# Patient Record
Sex: Male | Born: 1935 | Race: White | Hispanic: No | Marital: Married | Smoking: Former smoker
Health system: Southern US, Community
[De-identification: ages and names within clinical notes are randomized; demographics above are authoritative.]

## PROBLEM LIST (undated history)

## (undated) DIAGNOSIS — I2510 Atherosclerotic heart disease of native coronary artery without angina pectoris: Secondary | ICD-10-CM

## (undated) DIAGNOSIS — E119 Type 2 diabetes mellitus without complications: Secondary | ICD-10-CM

## (undated) DIAGNOSIS — I4891 Unspecified atrial fibrillation: Secondary | ICD-10-CM

## (undated) DIAGNOSIS — I639 Cerebral infarction, unspecified: Secondary | ICD-10-CM

## (undated) DIAGNOSIS — E039 Hypothyroidism, unspecified: Secondary | ICD-10-CM

## (undated) DIAGNOSIS — E079 Disorder of thyroid, unspecified: Secondary | ICD-10-CM

## (undated) DIAGNOSIS — I10 Essential (primary) hypertension: Secondary | ICD-10-CM

## (undated) HISTORY — PX: CAROTID STENT: SHX1301

---

## 2015-03-08 ENCOUNTER — Emergency Department (HOSPITAL_COMMUNITY): Payer: Commercial Managed Care - HMO

## 2015-03-08 ENCOUNTER — Encounter (HOSPITAL_COMMUNITY): Payer: Self-pay | Admitting: Nurse Practitioner

## 2015-03-08 ENCOUNTER — Observation Stay (HOSPITAL_COMMUNITY)
Admission: EM | Admit: 2015-03-08 | Discharge: 2015-03-10 | Disposition: A | Discharge status: Home / Self Care | Payer: Commercial Managed Care - HMO | Attending: Internal Medicine | Admitting: Internal Medicine

## 2015-03-08 DIAGNOSIS — R4781 Slurred speech: Secondary | ICD-10-CM | POA: Diagnosis not present

## 2015-03-08 DIAGNOSIS — I2510 Atherosclerotic heart disease of native coronary artery without angina pectoris: Secondary | ICD-10-CM | POA: Diagnosis not present

## 2015-03-08 DIAGNOSIS — Z7982 Long term (current) use of aspirin: Secondary | ICD-10-CM | POA: Diagnosis not present

## 2015-03-08 DIAGNOSIS — N179 Acute kidney failure, unspecified: Secondary | ICD-10-CM | POA: Diagnosis not present

## 2015-03-08 DIAGNOSIS — R531 Weakness: Secondary | ICD-10-CM | POA: Insufficient documentation

## 2015-03-08 DIAGNOSIS — R269 Unspecified abnormalities of gait and mobility: Secondary | ICD-10-CM | POA: Diagnosis not present

## 2015-03-08 DIAGNOSIS — I480 Paroxysmal atrial fibrillation: Secondary | ICD-10-CM | POA: Diagnosis not present

## 2015-03-08 DIAGNOSIS — E119 Type 2 diabetes mellitus without complications: Secondary | ICD-10-CM | POA: Diagnosis not present

## 2015-03-08 DIAGNOSIS — E039 Hypothyroidism, unspecified: Secondary | ICD-10-CM | POA: Diagnosis not present

## 2015-03-08 DIAGNOSIS — D649 Anemia, unspecified: Secondary | ICD-10-CM | POA: Diagnosis present

## 2015-03-08 DIAGNOSIS — E785 Hyperlipidemia, unspecified: Secondary | ICD-10-CM | POA: Insufficient documentation

## 2015-03-08 DIAGNOSIS — Z7901 Long term (current) use of anticoagulants: Secondary | ICD-10-CM | POA: Diagnosis not present

## 2015-03-08 DIAGNOSIS — R4182 Altered mental status, unspecified: Secondary | ICD-10-CM | POA: Diagnosis present

## 2015-03-08 DIAGNOSIS — G450 Vertebro-basilar artery syndrome: Secondary | ICD-10-CM | POA: Diagnosis not present

## 2015-03-08 DIAGNOSIS — I10 Essential (primary) hypertension: Secondary | ICD-10-CM | POA: Diagnosis not present

## 2015-03-08 DIAGNOSIS — I639 Cerebral infarction, unspecified: Secondary | ICD-10-CM | POA: Diagnosis not present

## 2015-03-08 DIAGNOSIS — I4891 Unspecified atrial fibrillation: Secondary | ICD-10-CM | POA: Diagnosis present

## 2015-03-08 DIAGNOSIS — Z6830 Body mass index (BMI) 30.0-30.9, adult: Secondary | ICD-10-CM | POA: Diagnosis not present

## 2015-03-08 DIAGNOSIS — N400 Enlarged prostate without lower urinary tract symptoms: Secondary | ICD-10-CM | POA: Insufficient documentation

## 2015-03-08 DIAGNOSIS — E669 Obesity, unspecified: Secondary | ICD-10-CM | POA: Insufficient documentation

## 2015-03-08 DIAGNOSIS — G459 Transient cerebral ischemic attack, unspecified: Principal | ICD-10-CM | POA: Diagnosis present

## 2015-03-08 HISTORY — DX: Hypothyroidism, unspecified: E03.9

## 2015-03-08 HISTORY — DX: Unspecified atrial fibrillation: I48.91

## 2015-03-08 HISTORY — DX: Essential (primary) hypertension: I10

## 2015-03-08 HISTORY — DX: Atherosclerotic heart disease of native coronary artery without angina pectoris: I25.10

## 2015-03-08 HISTORY — DX: Disorder of thyroid, unspecified: E07.9

## 2015-03-08 HISTORY — DX: Cerebral infarction, unspecified: I63.9

## 2015-03-08 HISTORY — DX: Type 2 diabetes mellitus without complications: E11.9

## 2015-03-08 LAB — URINALYSIS, ROUTINE W REFLEX MICROSCOPIC
Bilirubin Urine: NEGATIVE
Glucose, UA: 250 mg/dL — AB
Ketones, ur: NEGATIVE mg/dL
Leukocytes, UA: NEGATIVE
Nitrite: NEGATIVE
Protein, ur: >300 mg/dL — AB
SPECIFIC GRAVITY, URINE: 1.025 (ref 1.005–1.030)
Urobilinogen, UA: 0.2 mg/dL (ref 0.0–1.0)
pH: 5.5 (ref 5.0–8.0)

## 2015-03-08 LAB — COMPREHENSIVE METABOLIC PANEL
ALT: 18 U/L (ref 0–53)
AST: 22 U/L (ref 0–37)
Albumin: 2.4 g/dL — ABNORMAL LOW (ref 3.5–5.2)
Alkaline Phosphatase: 50 U/L (ref 39–117)
Anion gap: 10 (ref 5–15)
BILIRUBIN TOTAL: 0.5 mg/dL (ref 0.3–1.2)
BUN: 22 mg/dL (ref 6–23)
CALCIUM: 8.8 mg/dL (ref 8.4–10.5)
CO2: 25 mmol/L (ref 19–32)
CREATININE: 1.46 mg/dL — AB (ref 0.50–1.35)
Chloride: 105 mmol/L (ref 96–112)
GFR calc non Af Amer: 44 mL/min — ABNORMAL LOW (ref >90)
GFR, EST AFRICAN AMERICAN: 51 mL/min — AB (ref >90)
Glucose, Bld: 215 mg/dL — ABNORMAL HIGH (ref 70–99)
POTASSIUM: 3.9 mmol/L (ref 3.5–5.1)
Sodium: 140 mmol/L (ref 135–145)
Total Protein: 6 g/dL (ref 6.0–8.3)

## 2015-03-08 LAB — DIFFERENTIAL
Basophils Absolute: 0 10*3/uL (ref 0.0–0.1)
Basophils Relative: 1 % (ref 0–1)
EOS PCT: 1 % (ref 0–5)
Eosinophils Absolute: 0.1 10*3/uL (ref 0.0–0.7)
LYMPHS PCT: 26 % (ref 12–46)
Lymphs Abs: 1.3 10*3/uL (ref 0.7–4.0)
MONOS PCT: 13 % — AB (ref 3–12)
Monocytes Absolute: 0.6 10*3/uL (ref 0.1–1.0)
NEUTROS ABS: 2.9 10*3/uL (ref 1.7–7.7)
NEUTROS PCT: 59 % (ref 43–77)

## 2015-03-08 LAB — PROTIME-INR
INR: 0.96 (ref 0.00–1.49)
Prothrombin Time: 12.8 seconds (ref 11.6–15.2)

## 2015-03-08 LAB — CBC
HEMATOCRIT: 36.7 % — AB (ref 39.0–52.0)
HEMOGLOBIN: 12 g/dL — AB (ref 13.0–17.0)
MCH: 28.2 pg (ref 26.0–34.0)
MCHC: 32.7 g/dL (ref 30.0–36.0)
MCV: 86.2 fL (ref 78.0–100.0)
PLATELETS: 146 10*3/uL — AB (ref 150–400)
RBC: 4.26 MIL/uL (ref 4.22–5.81)
RDW: 18.4 % — ABNORMAL HIGH (ref 11.5–15.5)
WBC: 5 10*3/uL (ref 4.0–10.5)

## 2015-03-08 LAB — I-STAT TROPONIN, ED: Troponin i, poc: 0.01 ng/mL (ref 0.00–0.08)

## 2015-03-08 LAB — URINE MICROSCOPIC-ADD ON

## 2015-03-08 LAB — APTT: APTT: 32 s (ref 24–37)

## 2015-03-08 LAB — I-STAT CHEM 8, ED
BUN: 24 mg/dL — ABNORMAL HIGH (ref 6–23)
CALCIUM ION: 1.17 mmol/L (ref 1.13–1.30)
CHLORIDE: 107 mmol/L (ref 96–112)
Creatinine, Ser: 1.4 mg/dL — ABNORMAL HIGH (ref 0.50–1.35)
GLUCOSE: 218 mg/dL — AB (ref 70–99)
HCT: 36 % — ABNORMAL LOW (ref 39.0–52.0)
Hemoglobin: 12.2 g/dL — ABNORMAL LOW (ref 13.0–17.0)
Potassium: 4 mmol/L (ref 3.5–5.1)
SODIUM: 142 mmol/L (ref 135–145)
TCO2: 23 mmol/L (ref 0–100)

## 2015-03-08 LAB — ETHANOL

## 2015-03-08 LAB — RAPID URINE DRUG SCREEN, HOSP PERFORMED
AMPHETAMINES: NOT DETECTED
Barbiturates: NOT DETECTED
Benzodiazepines: NOT DETECTED
Cocaine: NOT DETECTED
OPIATES: NOT DETECTED
Tetrahydrocannabinol: NOT DETECTED

## 2015-03-08 LAB — CBG MONITORING, ED: Glucose-Capillary: 182 mg/dL — ABNORMAL HIGH (ref 70–99)

## 2015-03-08 MED ORDER — ASPIRIN EC 81 MG PO TBEC
81.0000 mg | DELAYED_RELEASE_TABLET | Freq: Every day | ORAL | Status: DC
  Administered 2015-03-08 – 2015-03-10 (×3): 81 mg via ORAL
  Filled 2015-03-08 (×3): qty 1

## 2015-03-08 MED ORDER — HYDRALAZINE HCL 20 MG/ML IJ SOLN
5.0000 mg | Freq: Four times a day (QID) | INTRAMUSCULAR | Status: DC | PRN
  Administered 2015-03-09 (×2): 5 mg via INTRAVENOUS
  Filled 2015-03-08 (×2): qty 1

## 2015-03-08 MED ORDER — TAMSULOSIN HCL 0.4 MG PO CAPS
0.4000 mg | ORAL_CAPSULE | Freq: Every day | ORAL | Status: DC
  Administered 2015-03-09 (×2): 0.4 mg via ORAL
  Filled 2015-03-08 (×2): qty 1

## 2015-03-08 MED ORDER — ASPIRIN 325 MG PO TABS: 325.0000 mg | ORAL_TABLET | Freq: Every day | ORAL | Status: DC

## 2015-03-08 MED ORDER — ENOXAPARIN SODIUM 30 MG/0.3ML ~~LOC~~ SOLN
30.0000 mg | SUBCUTANEOUS | Status: DC
  Administered 2015-03-09: 30 mg via SUBCUTANEOUS
  Filled 2015-03-08: qty 0.3

## 2015-03-08 MED ORDER — ASPIRIN 300 MG RE SUPP: 300.0000 mg | Freq: Every day | RECTAL | Status: DC

## 2015-03-08 MED ORDER — HYDROMORPHONE HCL 1 MG/ML IJ SOLN
0.5000 mg | INTRAMUSCULAR | Status: DC | PRN
  Administered 2015-03-09: 1 mg via INTRAVENOUS
  Filled 2015-03-08: qty 1

## 2015-03-08 MED ORDER — SENNOSIDES-DOCUSATE SODIUM 8.6-50 MG PO TABS: 1.0000 | ORAL_TABLET | Freq: Every evening | ORAL | Status: DC | PRN

## 2015-03-08 MED ORDER — ATENOLOL 100 MG PO TABS
100.0000 mg | ORAL_TABLET | Freq: Two times a day (BID) | ORAL | Status: DC
  Administered 2015-03-09 – 2015-03-10 (×4): 100 mg via ORAL
  Filled 2015-03-08 (×4): qty 1

## 2015-03-08 MED ORDER — ADULT MULTIVITAMIN W/MINERALS CH
1.0000 | ORAL_TABLET | Freq: Every day | ORAL | Status: DC
  Administered 2015-03-09 – 2015-03-10 (×2): 1 via ORAL
  Filled 2015-03-08 (×2): qty 1

## 2015-03-08 MED ORDER — LISINOPRIL 20 MG PO TABS
40.0000 mg | ORAL_TABLET | Freq: Every day | ORAL | Status: DC
  Administered 2015-03-09: 40 mg via ORAL
  Filled 2015-03-08: qty 2

## 2015-03-08 MED ORDER — HYDROCHLOROTHIAZIDE 25 MG PO TABS: 25.0000 mg | ORAL_TABLET | Freq: Every day | ORAL | Status: DC | PRN

## 2015-03-08 MED ORDER — DILTIAZEM HCL ER COATED BEADS 240 MG PO CP24
240.0000 mg | ORAL_CAPSULE | Freq: Every day | ORAL | Status: DC
  Administered 2015-03-09 – 2015-03-10 (×2): 240 mg via ORAL
  Filled 2015-03-08 (×2): qty 1

## 2015-03-08 MED ORDER — STROKE: EARLY STAGES OF RECOVERY BOOK
Freq: Once | Status: AC
  Administered 2015-03-09: 1

## 2015-03-08 MED ORDER — INSULIN ASPART 100 UNIT/ML ~~LOC~~ SOLN
0.0000 [IU] | SUBCUTANEOUS | Status: DC
  Administered 2015-03-09 (×2): 2 [IU] via SUBCUTANEOUS
  Administered 2015-03-09 (×2): 1 [IU] via SUBCUTANEOUS
  Administered 2015-03-09: 2 [IU] via SUBCUTANEOUS
  Administered 2015-03-10: 1 [IU] via SUBCUTANEOUS
  Administered 2015-03-10: 2 [IU] via SUBCUTANEOUS

## 2015-03-08 MED ORDER — PRAVASTATIN SODIUM 40 MG PO TABS: 40.0000 mg | ORAL_TABLET | Freq: Every day | ORAL | Status: DC

## 2015-03-08 MED ORDER — LEVOTHYROXINE SODIUM 25 MCG PO TABS
25.0000 ug | ORAL_TABLET | Freq: Every day | ORAL | Status: DC
  Administered 2015-03-09 – 2015-03-10 (×2): 25 ug via ORAL
  Filled 2015-03-08 (×2): qty 1

## 2015-03-08 MED ORDER — ALLOPURINOL 100 MG PO TABS
100.0000 mg | ORAL_TABLET | Freq: Every day | ORAL | Status: DC
  Administered 2015-03-09 – 2015-03-10 (×2): 100 mg via ORAL
  Filled 2015-03-08 (×2): qty 1

## 2015-03-08 MED ORDER — ONDANSETRON HCL 4 MG/2ML IJ SOLN
INTRAMUSCULAR | Status: AC
  Administered 2015-03-08: 4 mg
  Filled 2015-03-08: qty 2

## 2015-03-08 MED ORDER — SODIUM CHLORIDE 0.9 % IV SOLN
INTRAVENOUS | Status: AC
  Administered 2015-03-08: 22:00:00 via INTRAVENOUS

## 2015-03-08 NOTE — ED Provider Notes (Signed)
CSN: [id number]     Arrival date & time 03/08/15  1853 History   First MD Initiated Contact with Patient 03/08/15 1910     Chief Complaint  Patient presents with  . Altered Mental Status  . Code Stroke     (Consider location/radiation/quality/duration/timing/severity/associated sxs/prior Treatment) Patient is a 81 y.o. male presenting with altered mental status. The history is provided by the EMS personnel. The history is limited by the condition of the patient.  Altered Mental Status  patient level V caveat. Patient with marked confusion.  Patient last seen normal by family at 1:00 in the afternoon. EMS stated patient was recently treated for a CVA 6 weeks ago at [facility]. Patient's family noted increased confusion and abnormal gait. Patient not able to speak very well. Patient could follow some commands. No facial droop was noted.  Patient was made a code stroke prior to arrival.  Past Medical History  Diagnosis Date  . CVA (cerebral infarction)   . Coronary artery disease   . Hypertension   . Thyroid disease   . Hypothyroid   . Diabetes mellitus without complication   . Atrial fibrillation    Past Surgical History  Procedure Laterality Date  . Carotid stent     No family history on file. History  Substance Use Topics  . Smoking status: Not on file  . Smokeless tobacco: Not on file  . Alcohol Use: Not on file    Review of Systems  Unable to perform ROS  level V caveat applies patient with marked confusion upon arrival.    Allergies  Epinephrine; Amiodarone; Tramadol; Xarelto; and Dabigatran etexilate mesylate  Home Medications   Prior to Admission medications   Medication Sig Start Date End Date Taking? Authorizing Provider  acetaminophen (TYLENOL) 325 MG tablet Take 325-650 mg by mouth every 6 (six) hours as needed (pain).   Yes Historical Provider, MD  allopurinol (ZYLOPRIM) 100 MG tablet Take 100 mg by mouth daily. 02/27/15  Yes Historical Provider,  MD  aspirin EC 81 MG tablet Take 81 mg by mouth daily.    Yes Historical Provider, MD  atenolol (TENORMIN) 100 MG tablet Take 100 mg by mouth 2 (two) times daily.  03/06/15  Yes Historical Provider, MD  diltiazem (CARDIZEM CD) 240 MG 24 hr capsule Take 240 mg by mouth daily. 02/22/15  Yes Historical Provider, MD  hydrochlorothiazide (HYDRODIURIL) 25 MG tablet Take 25 mg by mouth daily as needed (swelling/ fluid build up).    Yes Historical Provider, MD  levothyroxine (SYNTHROID, LEVOTHROID) 25 MCG tablet Take 25 mcg by mouth daily before breakfast.  02/17/15  Yes Historical Provider, MD  lisinopril (PRINIVIL,ZESTRIL) 40 MG tablet Take 40 mg by mouth daily.  03/06/15  Yes Historical Provider, MD  lovastatin (MEVACOR) 40 MG tablet Take 80 mg by mouth at bedtime.  02/07/15  Yes Historical Provider, MD  metFORMIN (GLUCOPHAGE) 850 MG tablet Take 850 mg by mouth 2 (two) times daily with a meal. With breakfast and supper 03/06/15  Yes Historical Provider, MD  Multiple Vitamin (MULTIVITAMIN WITH MINERALS) TABS tablet Take 1 tablet by mouth daily.   Yes Historical Provider, MD  tamsulosin (FLOMAX) 0.4 MG CAPS capsule Take 0.4 mg by mouth at bedtime. 02/27/15  Yes Historical Provider, MD  Testosterone 20.25 MG/1.25GM (1.62%) GEL Place 81 mg onto the skin daily. 4 pumps daily   Yes Historical Provider, MD   BP 219/74 mmHg  Pulse 80  Temp(Src) 98.6 F (37 C) (Oral)  Resp  13  Ht 5' 10.5" (1.791 m)  Wt 216 lb (97.977 kg)  BMI 30.54 kg/m2  SpO2 96% Physical Exam  Constitutional: He appears well-developed and well-nourished.  HENT:  Head: Normocephalic and atraumatic.  Mouth/Throat: Oropharynx is clear and moist.  Eyes: Conjunctivae and EOM are normal. Pupils are equal, round, and reactive to light.  Neck: Normal range of motion.  Abdominal: Soft. Bowel sounds are normal.  Musculoskeletal: He exhibits no tenderness.  Neurological: A cranial nerve deficit is present. He exhibits normal muscle tone. Coordination  normal.  Confused and difficulty following commands. Not able to speak very well. Questionable generalized weakness to arms and legs. No obvious weakness.  Skin: Skin is warm.  Nursing note and vitals reviewed.   ED Course  Procedures (including critical care time) Labs Review Labs Reviewed  CBC - Abnormal; Notable for the following:    Hemoglobin 12.0 (*)    HCT 36.7 (*)    RDW 18.4 (*)    Platelets 146 (*)    All other components within normal limits  DIFFERENTIAL - Abnormal; Notable for the following:    Monocytes Relative 13 (*)    All other components within normal limits  COMPREHENSIVE METABOLIC PANEL - Abnormal; Notable for the following:    Glucose, Bld 215 (*)    Creatinine, Ser 1.46 (*)    Albumin 2.4 (*)    GFR calc non Af Amer 44 (*)    GFR calc Af Amer 51 (*)    All other components within normal limits  URINALYSIS, ROUTINE W REFLEX MICROSCOPIC - Abnormal; Notable for the following:    Glucose, UA 250 (*)    Hgb urine dipstick SMALL (*)    Protein, ur >300 (*)    All other components within normal limits  I-STAT CHEM 8, ED - Abnormal; Notable for the following:    BUN 24 (*)    Creatinine, Ser 1.40 (*)    Glucose, Bld 218 (*)    Hemoglobin 12.2 (*)    HCT 36.0 (*)    All other components within normal limits  CBG MONITORING, ED - Abnormal; Notable for the following:    Glucose-Capillary 182 (*)    All other components within normal limits  ETHANOL  PROTIME-INR  APTT  URINE RAPID DRUG SCREEN (HOSP PERFORMED)  URINE MICROSCOPIC-ADD ON  I-STAT TROPOININ, ED  I-STAT TROPOININ, ED    Imaging Review Ct Head Wo Contrast  03/08/2015   CLINICAL DATA:  Altered mental status.  Stroke 6 weeks ago.  EXAM: CT HEAD WITHOUT CONTRAST  TECHNIQUE: Contiguous axial images were obtained from the base of the skull through the vertex without intravenous contrast.  COMPARISON:  Brain MR dated 03/21/2012 and head CT dated 10/08/2011.  FINDINGS: Diffusely enlarged ventricles  and subarachnoid spaces. Patchy white matter low density in both cerebral hemispheres. Old right cerebellar hemisphere lacunar infarct without significant change. No intracranial hemorrhage, mass lesion or CT evidence of acute infarction. Unremarkable bones. Inferior bilateral maxillary sinus mucosal thickening and retention cyst formation.  IMPRESSION: 1. No acute abnormality. 2. Mildly progressive atrophy and chronic small vessel white matter ischemic changes and stable old right cerebellar hemisphere lacunar infarct. 3. Mild chronic bilateral maxillary sinusitis. These results were called by telephone at the time of interpretation on 03/08/2015 at 7:16 pm to Dr. Armida Sans, who verbally acknowledged these results.   Electronically Signed   By: Claudie Revering M.D.   On: 03/08/2015 19:16   [name]  03/08/2015  CLINICAL DATA:  81 year old male code stroke with confusion, impaired gait, nausea and vomiting. Ischemic stroke 4 weeks ago treated with thrombolytics. Initial encounter.  EXAM: MRI HEAD WITHOUT CONTRAST  MRA HEAD WITHOUT CONTRAST  TECHNIQUE: Multiplanar, multiecho pulse sequences of the brain and surrounding structures were obtained without intravenous contrast. Angiographic images of the head were obtained using MRA technique without contrast.  COMPARISON:  Head CT without contrast 1911 hours today. Brain MRI 03/21/2012  FINDINGS: MRI HEAD FINDINGS  Major intracranial vascular flow voids are stable. No restricted diffusion to suggest acute infarction. No midline shift, mass effect, evidence of mass lesion, ventriculomegaly, extra-axial collection or acute intracranial hemorrhage. Cervicomedullary junction and pituitary are within normal limits. Cerebral volume has not significantly changed since 2013. Scattered and patchy cerebral white matter T2 and FLAIR hyperintensity also appears stable. A small chronic infarct in the inferior right cerebellum is unchanged.  Normal bone marrow signal.  Negative visualized cervical spine. Visualized orbit soft tissues are within normal limits. Stable paranasal sinuses and mastoids. Visualized scalp soft tissues are within normal limits.  MRA HEAD FINDINGS  Antegrade flow in the posterior circulation with codominant distal vertebral arteries. Patent right PICA origin. Normal vertebrobasilar junction. Dominant appearing right AICA. No basilar artery stenosis. SCA and PCA origins are within normal limits. Posterior communicating arteries are diminutive or absent.  Left PCA branches are within normal limits. Moderate stenosis at the origin of the right P3 superior division with preserved distal flow.  Antegrade flow in both ICA siphons. Moderate stenosis of the right ICA cavernous segment. Both carotid termini are patent.  Dominant appearing left ACA A1 segment. Anterior communicating artery and visualized bilateral ACA branches are within normal limits.  Both MCA origins are patent. There is mild stenosis in the left M1 segment with preserved distal flow and patent left MCA bifurcation. No major left MCA branch occlusion identified. Mild irregularity of the right M1 segment with patent right MCA bifurcation and no major right MCA branch occlusion identified.  IMPRESSION: 1.  No acute intracranial abnormality. 2. Stable appearance of chronic ischemic disease since 2013. 3. Intracranial atherosclerosis with moderate cavernous right ICA and right PCA P3 segment stenoses. No major circle of Willis branch occlusion.   Electronically Signed   By: Genevie Ann M.D.   On: 03/08/2015 21:16   Mr Brain Wo Contrast  03/08/2015   CLINICAL DATA:  81 year old male code stroke with confusion, impaired gait, nausea and vomiting. Ischemic stroke 4 weeks ago treated with thrombolytics. Initial encounter.  EXAM: MRI HEAD WITHOUT CONTRAST  MRA HEAD WITHOUT CONTRAST  TECHNIQUE: Multiplanar, multiecho pulse sequences of the brain and surrounding structures were obtained without intravenous  contrast. Angiographic images of the head were obtained using MRA technique without contrast.  COMPARISON:  Head CT without contrast 1911 hours today. Brain MRI 03/21/2012  FINDINGS: MRI HEAD FINDINGS  Major intracranial vascular flow voids are stable. No restricted diffusion to suggest acute infarction. No midline shift, mass effect, evidence of mass lesion, ventriculomegaly, extra-axial collection or acute intracranial hemorrhage. Cervicomedullary junction and pituitary are within normal limits. Cerebral volume has not significantly changed since 2013. Scattered and patchy cerebral white matter T2 and FLAIR hyperintensity also appears stable. A small chronic infarct in the inferior right cerebellum is unchanged.  Normal bone marrow signal. Negative visualized cervical spine. Visualized orbit soft tissues are within normal limits. Stable paranasal sinuses and mastoids. Visualized scalp soft tissues are within normal limits.  MRA HEAD FINDINGS  Antegrade flow in the  posterior circulation with codominant distal vertebral arteries. Patent right PICA origin. Normal vertebrobasilar junction. Dominant appearing right AICA. No basilar artery stenosis. SCA and PCA origins are within normal limits. Posterior communicating arteries are diminutive or absent.  Left PCA branches are within normal limits. Moderate stenosis at the origin of the right P3 superior division with preserved distal flow.  Antegrade flow in both ICA siphons. Moderate stenosis of the right ICA cavernous segment. Both carotid termini are patent.  Dominant appearing left ACA A1 segment. Anterior communicating artery and visualized bilateral ACA branches are within normal limits.  Both MCA origins are patent. There is mild stenosis in the left M1 segment with preserved distal flow and patent left MCA bifurcation. No major left MCA branch occlusion identified. Mild irregularity of the right M1 segment with patent right MCA bifurcation and no major right MCA  branch occlusion identified.  IMPRESSION: 1.  No acute intracranial abnormality. 2. Stable appearance of chronic ischemic disease since 2013. 3. Intracranial atherosclerosis with moderate cavernous right ICA and right PCA P3 segment stenoses. No major circle of Willis branch occlusion.   Electronically Signed   By: Genevie Ann M.D.   On: 03/08/2015 21:16     EKG Interpretation   Date/Time:  Saturday March 08 2015 19:21:04 EDT Ventricular Rate:  63 PR Interval:  163 QRS Duration: 93 QT Interval:  486 QTC Calculation: 498 R Axis:   60 Text Interpretation:  Sinus rhythm Anterior infarct, old Baseline wander  in lead(s) V3 No previous ECGs available Confirmed by Kourtnie Sachs  MD, Lunden Mcleish  [phone]) on 03/08/2015 9:21:05 PM      CRITICAL CARE Performed by: Fredia Sorrow Total critical care time: 30 Critical care time was exclusive of separately billable procedures and treating other patients. Critical care was necessary to treat or prevent imminent or life-threatening deterioration. Critical care was time spent personally by me on the following activities: development of treatment plan with patient and/or surrogate as well as nursing, discussions with consultants, evaluation of patient's response to treatment, examination of patient, obtaining history from patient or surrogate, ordering and performing treatments and interventions, ordering and review of laboratory studies, ordering and review of radiographic studies, pulse oximetry and re-evaluation of patient's condition.     MDM   Final diagnoses:  Stroke with cerebral ischemia  Stroke with cerebral ischemia    Patient arrived as a code stroke was called by EMS prior to arrival. Patient was last seen normal at 1:00 by family. Patient with confusion and somewhat altered mental status and seemed to have weakness according to EMS in all extremities but right hand greater than others. Patient was able to follow some commands but not all. Head CT  negative. Evaluated by nerve neurology. Recommended admission. MRI ordered.  [place] were his primary care provider's. Patient 3 weeks ago had a. Return episode where he got confused while driving. So his primary care doctor a week later they sent him to urology for workup for possible stroke. But that workup has not been completed. He did not have an MRI. Family has noted that he has had some confusion on and off of late. No formal diagnosis of CVA.    Fredia Sorrow, MD 03/08/15 2132

## 2015-03-08 NOTE — ED Notes (Signed)
Per EMS pt from home, LSN 1300 by family. Was recently treated for CVA 6 weeks ago. Family noted increased confusion and abnormal gait- EMS did not note any facial droop, or any weakness particular to one side. Patient follows some commands and can say first name but inappropriately answers other questions.

## 2015-03-08 NOTE — Consult Note (Signed)
Referring Physician: ED    Chief Complaint: code stroke, confusion, impaired gait  HPI:                                                                                                                                         Mario Small is an 81 y.o. male with a past medical history significant for HTN, DM, CAD s/p stenting, atrial fibrillation no taking anticoagulants, ischemic stroke 4 weeks that according to EMS was treated with thrombolytics at an outside facility, brought in by EMS as a code stroke due to the4 above mentioned symptoms. Patient lives at home and was noted by family to be increasingly confused (had been confused and forgetful since his stroke), making no sense during conversations, completely off balance. Family said that he has been falling lately. He is confused but without obvious focal weakness at this moment. Very nauseated and vomiting. NIHSS 6. CT brain was personally reviewed and showed no acute abnormality.  Date last known well: 03/08/15 Time last known well: 1 pm tPA Given: no, out of the window NIHSS: 6   Past Medical History  Diagnosis Date  . CVA (cerebral infarction)   . Coronary artery disease   . Hypertension   . Thyroid disease   . Hypothyroid   . Diabetes mellitus without complication   . Atrial fibrillation     Past Surgical History  Procedure Laterality Date  . Carotid stent      No family history on file. Social History:  has no tobacco, alcohol, and drug history on file.  Family history: no brain tumors, brain aneurysms, or epilepsy  Allergies: No Known Allergies  Medications:                                                                                                                           I have reviewed the patient's current medications.  ROS: unable to obtain due to mental status  History  obtained from chart review and family   Physical exam: pleasantly confused male in no apparent distress. Blood pressure 219/74, pulse 80, temperature 98.3 F (36.8 C), temperature source Oral, resp. rate 13, height 5' 10.5" (1.791 m), weight 97.977 kg (216 lb), SpO2 96 %. Head: normocephalic. Neck: supple, no bruits, no JVD. Cardiac: no murmurs. Lungs: clear. Abdomen: soft, no tender, no mass. Extremities: mild bilateral LE edema. Skin: no rash Neurologic Examination:                                                                                                      General: Mental Status: Alert and awake but disoriented to place-year-month.  Speech fluent without evidence of aphasia.  Able to follow 3 step commands without difficulty. Cranial Nerves: II: Discs flat bilaterally; Visual fields grossly normal, pupils equal, round, reactive to light and accommodation III,IV, VI: ptosis not present, extra-ocular motions intact bilaterally V,VII: smile symmetric, facial light touch sensation normal bilaterally VIII: hearing normal bilaterally IX,X: uvula rises symmetrically XI: bilateral shoulder shrug XII: midline tongue extension without atrophy or fasciculations  Motor: Right : Upper extremity   5/5    Left:     Upper extremity   5/5  Lower extremity   5/5     Lower extremity   5/5 Tone and bulk:normal tone throughout; no atrophy noted Sensory: Pinprick and light touch intact throughout, bilaterally Deep Tendon Reflexes:  Right: Upper Extremity   Left: Upper extremity   biceps (C-5 to C-6) 2/4   biceps (C-5 to C-6) 2/4 tricep (C7) 2/4    triceps (C7) 2/4 Brachioradialis (C6) 2/4  Brachioradialis (C6) 2/4  Lower Extremity Lower Extremity  quadriceps (L-2 to L-4) 2/4   quadriceps (L-2 to L-4) 2/4 Achilles (S1) 2/4   Achilles (S1) 2/4  Plantars: Right: downgoing   Left: downgoing Cerebellar: Unable to test due to confusion Gait:  Unable to test at this  moment     Results for orders placed or performed during the hospital encounter of 03/08/15 (from the past 48 hour(s))  I-Stat Troponin, ED (not at [facility])     Status: None   Collection Time: 03/08/15  7:06 PM  Result Value Ref Range   Troponin i, poc 0.01 0.00 - 0.08 ng/mL   Comment 3            Comment: Due to the release kinetics of cTnI, a negative result within the first hours of the onset of symptoms does not rule out myocardial infarction with certainty. If myocardial infarction is still suspected, repeat the test at appropriate intervals.   I-Stat Chem 8, ED     Status: Abnormal   Collection Time: 03/08/15  7:08 PM  Result Value Ref Range   Sodium 142 135 - 145 mmol/L   Potassium 4.0 3.5 - 5.1 mmol/L   Chloride 107 96 - 112 mmol/L   BUN 24 (H) 6 - 23 mg/dL   Creatinine, Ser 1.40 (H) 0.50 - 1.35 mg/dL   Glucose, Bld 218 (H) 70 - 99 mg/dL   Calcium, Ion 1.17  1.13 - 1.30 mmol/L   TCO2 23 0 - 100 mmol/L   Hemoglobin 12.2 (L) 13.0 - 17.0 g/dL   HCT 36.0 (L) 39.0 - 52.0 %   Ct Head Wo Contrast  03/08/2015   CLINICAL DATA:  Altered mental status.  Stroke 6 weeks ago.  EXAM: CT HEAD WITHOUT CONTRAST  TECHNIQUE: Contiguous axial images were obtained from the base of the skull through the vertex without intravenous contrast.  COMPARISON:  Brain MR dated 03/21/2012 and head CT dated 10/08/2011.  FINDINGS: Diffusely enlarged ventricles and subarachnoid spaces. Patchy white matter low density in both cerebral hemispheres. Old right cerebellar hemisphere lacunar infarct without significant change. No intracranial hemorrhage, mass lesion or CT evidence of acute infarction. Unremarkable bones. Inferior bilateral maxillary sinus mucosal thickening and retention cyst formation.  IMPRESSION: 1. No acute abnormality. 2. Mildly progressive atrophy and chronic small vessel white matter ischemic changes and stable old right cerebellar hemisphere lacunar infarct. 3. Mild chronic bilateral maxillary  sinusitis. These results were called by telephone at the time of interpretation on 03/08/2015 at 7:16 pm to Dr. Armida Sans, who verbally acknowledged these results.   Electronically Signed   By: Claudie Revering M.D.   On: 03/08/2015 19:16     Assessment: 81 y.o. male with multiple risk factors for stroke, brought in with worsening confusion, unsteadiness. Nauseated and vomiting in the ED. NIHSS 6, unrevealing CT brain. Out of the window for IV thrombolysis and large vessel thrombus/clot seems unlikely. Suspect posterior circulation stroke. Unable to locate outside medical records, thus will consider repeating stroke work up. Aspirin after passing swallowing evaluation   Stroke Risk Factors - age, HTN, DM, CAD s/p stenting, atrial fibrillation, prior stroke   Plan: 1. HgbA1c, fasting lipid panel 2. MRI, MRA  of the brain without contrast 3. Echocardiogram 4. Carotid dopplers 5. Prophylactic therapy-aspirin 6. Risk factor modification 7. Telemetry monitoring 8. Frequent neuro checks 9. PT/OT SLP 10. NPO until swallowing evaluation by RN stroke.  Dorian Pod, MD [facility] [phone]  03/08/2015, 7:24 PM

## 2015-03-08 NOTE — H&P (Signed)
[facility] Admission History and Physical       Mario Small XFG:[id number] DOB: 1933-06-29 DOA: 03/08/2015  Referring physician: EDP PCP: No primary care provider on file.  Specialists:   Chief Complaint: Headache Confusion Slurred Speech and Weakness  HPI: Mario Small is a 81 y.o. male with a history of CVA (in 2013), HTN, CAD, Paroxsymal Atrial Fibrillation ( with Adverse Effects on Anticoagulants), HTN, DM2 who presents to the ED with complaints of increased confusion, with difficulty speaking, and difficulty walking   noticed by family today around 2 PM.   He also was complaining of a severe Headache in the back of his head, and his blood pressures were checked and found to be with a systolic of 967.   His family was concerned that he may be having a stroke and had EMS brought him to [facility] from his home in [place].   He was seen in the ED as  A Code Stroke and was outside of the widow for TPA, and  A CT scan of the Head and MRI and MRA of the Brain were performed and were negative for findings of a Stroke.  He was seen by Neurology Dr Aram Beecham in the ED and referred for further evaluation.        Review of Systems:  Constitutional: No Weight Loss, No Weight Gain, Night Sweats, Fevers, Chills, +Dizziness, Light Headedness, Fatigue, +Generalized Weakness HEENT:  +Headaches, Difficulty Swallowing,Tooth/Dental Problems,Sore Throat,  No Sneezing, Rhinitis, Ear Ache, Nasal Congestion, or Post Nasal Drip,  Cardio-vascular:  No Chest pain, Orthopnea, PND, Edema in Lower Extremities, Anasarca, Dizziness, Palpitations  Resp: No Dyspnea, No DOE, No Productive Cough, No Non-Productive Cough, No Hemoptysis, No Wheezing.    GI: No Heartburn, Indigestion, Abdominal Pain, Nausea, Vomiting, Diarrhea, Constipation, Hematemesis, Hematochezia, Melena, Change in Bowel Habits,  Loss of Appetite  GU: No Dysuria, No Change in Color of Urine, No Urgency or Urinary Frequency, No  Flank pain.  Musculoskeletal: No Joint Pain or Swelling, No Decreased Range of Motion, No Back Pain.  Neurologic: No Syncope, No Seizures, Muscle Weakness, Paresthesia, Vision Disturbance or Loss, No Diplopia, No Vertigo, +Difficulty Walking,  Skin: No Rash or Lesions. Psych: No Change in Mood or Affect, No Depression or Anxiety, No Memory loss,+Confusion, or Hallucinations   Past Medical History  Diagnosis Date  . CVA (cerebral infarction)   . Coronary artery disease   . Hypertension   . Thyroid disease   . Hypothyroid   . Diabetes mellitus without complication   . Atrial fibrillation      Past Surgical History  Procedure Laterality Date  . Carotid stent        Prior to Admission medications   Medication Sig Start Date End Date Taking? Authorizing Provider  acetaminophen (TYLENOL) 325 MG tablet Take 325-650 mg by mouth every 6 (six) hours as needed (pain).   Yes Historical Provider, MD  allopurinol (ZYLOPRIM) 100 MG tablet Take 100 mg by mouth daily. 02/27/15  Yes Historical Provider, MD  aspirin EC 81 MG tablet Take 81 mg by mouth daily.    Yes Historical Provider, MD  atenolol (TENORMIN) 100 MG tablet Take 100 mg by mouth 2 (two) times daily.  03/06/15  Yes Historical Provider, MD  diltiazem (CARDIZEM CD) 240 MG 24 hr capsule Take 240 mg by mouth daily. 02/22/15  Yes Historical Provider, MD  hydrochlorothiazide (HYDRODIURIL) 25 MG tablet Take 25 mg by mouth daily as needed (swelling/ fluid build up).  Yes Historical Provider, MD  levothyroxine (SYNTHROID, LEVOTHROID) 25 MCG tablet Take 25 mcg by mouth daily before breakfast.  02/17/15  Yes Historical Provider, MD  lisinopril (PRINIVIL,ZESTRIL) 40 MG tablet Take 40 mg by mouth daily.  03/06/15  Yes Historical Provider, MD  lovastatin (MEVACOR) 40 MG tablet Take 80 mg by mouth at bedtime.  02/07/15  Yes Historical Provider, MD  metFORMIN (GLUCOPHAGE) 850 MG tablet Take 850 mg by mouth 2 (two) times daily with a meal. With breakfast and  supper 03/06/15  Yes Historical Provider, MD  Multiple Vitamin (MULTIVITAMIN WITH MINERALS) TABS tablet Take 1 tablet by mouth daily.   Yes Historical Provider, MD  tamsulosin (FLOMAX) 0.4 MG CAPS capsule Take 0.4 mg by mouth at bedtime. 02/27/15  Yes Historical Provider, MD  Testosterone 20.25 MG/1.25GM (1.62%) GEL Place 81 mg onto the skin daily. 4 pumps daily/ apply to inner thighs   Yes Historical Provider, MD     Allergies  Allergen Reactions  . Epinephrine Shortness Of Breath  . Amiodarone Nausea And Vomiting    Severe nausea and vomiting  . Tramadol Other (See Comments)    Caused aggression  . Xarelto [Rivaroxaban] Other (See Comments)    Severe bleeding  . Dabigatran Etexilate Mesylate Nausea And Vomiting    Reaction to Pradaxa    Social History:  has no tobacco, alcohol, and drug history on file.    No family history on file.     Physical Exam:  GEN:  Pleasant Elderly Obese 81 y.o. Caucasian male examined and in no acute distress; cooperative with exam Filed Vitals:   03/08/15 2145 03/08/15 2200 03/08/15 2215 03/08/15 2245  BP: 167/99 172/62 165/59 154/52  Pulse: 62 64 64   Temp:      TempSrc:      Resp: 20 19 18 22   Height:      Weight:      SpO2: 96% 94% 97%    Blood pressure 154/52, pulse 64, temperature 98.6 F (37 C), temperature source Oral, resp. rate 22, height 5' 10.5" (1.791 m), weight 97.977 kg (216 lb), SpO2 97 %. PSYCH: He is alert and oriented x4; does not appear anxious does not appear depressed; affect is normal HEENT: Normocephalic and Atraumatic, Mucous membranes pink; PERRLA; EOM intact; Fundi:  Bilateral Cataracts Present;  No scleral icterus, Nares: Patent, Oropharynx: Clear, Fair Dentition,    Neck:  FROM, No Cervical Lymphadenopathy nor Thyromegaly or Carotid Bruit; No JVD; Breasts:: Not examined CHEST WALL: No tenderness CHEST: Normal respiration, clear to auscultation bilaterally HEART: Regular rate and rhythm; no murmurs rubs or  gallops BACK: No kyphosis or scoliosis; No CVA tenderness ABDOMEN: Positive Bowel Sounds, Obese, Soft Non-Tender, No Rebound or Guarding; No Masses, No Organomegaly, No Pannus; No Intertriginous candida. Rectal Exam: Not done EXTREMITIES: No Cyanosis, Clubbing, or Edema; No Ulcerations. Genitalia: not examined PULSES: 2+ and symmetric SKIN: Normal hydration no rash or ulceration  CNS:  Alert and Oriented x 4, No Focal Deficits Mental Status:  Alert, Oriented, Thought Content Appropriate. Speech Fluent without evidence of Aphasia. Able to follow 3 step commands without difficulty.  In No obvious pain.   Cranial Nerves:  II: Discs flat bilaterally; Visual fields Intact, Pupils equal and reactive.    III,IV, VI: Extra-ocular motions intact bilaterally    V,VII: smile symmetric, facial light touch sensation normal bilaterally    VIII: hearing intact or decreasesd bilaterally    IX,X: gag reflex present    XI: bilateral shoulder shrug  XII: midline tongue extension   Motor:  Right:  Upper extremity 5-/5     Left:  Upper extremity 5-/5     Right:  Lower extremity 5-/5    Left:  Lower extremity 5-/5     Tone and Bulk:  normal tone throughout; no atrophy noted   Sensory:  Pinprick and light touch intact throughout, bilaterally   Deep Tendon Reflexes: 2+ and symmetric throughout   Plantars/ Babinski:  Right:  normal Left: normal    Cerebellar:  Finger to nose without difficulty.   Gait: deferred    Vascular: pulses palpable throughout    Labs on Admission:  Basic Metabolic Panel:  Recent Labs Lab 03/08/15 1855 03/08/15 1908  NA 140 142  K 3.9 4.0  CL 105 107  CO2 25  --   GLUCOSE 215* 218*  BUN 22 24*  CREATININE 1.46* 1.40*  CALCIUM 8.8  --    Liver Function Tests:  Recent Labs Lab 03/08/15 1855  AST 22  ALT 18  ALKPHOS 50  BILITOT 0.5  PROT 6.0  ALBUMIN 2.4*   No results for input(s): LIPASE, AMYLASE in the last 168 hours. No results for input(s): AMMONIA  in the last 168 hours. CBC:  Recent Labs Lab 03/08/15 1855 03/08/15 1908  WBC 5.0  --   NEUTROABS 2.9  --   HGB 12.0* 12.2*  HCT 36.7* 36.0*  MCV 86.2  --   PLT 146*  --    Cardiac Enzymes: No results for input(s): CKTOTAL, CKMB, CKMBINDEX, TROPONINI in the last 168 hours.  BNP (last 3 results) No results for input(s): BNP in the last 8760 hours.  ProBNP (last 3 results) No results for input(s): PROBNP in the last 8760 hours.  CBG:  Recent Labs Lab 03/08/15 1926  GLUCAP 182*    Radiological Exams on Admission: Ct Head Wo Contrast  03/08/2015   CLINICAL DATA:  Altered mental status.  Stroke 6 weeks ago.  EXAM: CT HEAD WITHOUT CONTRAST  TECHNIQUE: Contiguous axial images were obtained from the base of the skull through the vertex without intravenous contrast.  COMPARISON:  Brain MR dated 03/21/2012 and head CT dated 10/08/2011.  FINDINGS: Diffusely enlarged ventricles and subarachnoid spaces. Patchy white matter low density in both cerebral hemispheres. Old right cerebellar hemisphere lacunar infarct without significant change. No intracranial hemorrhage, mass lesion or CT evidence of acute infarction. Unremarkable bones. Inferior bilateral maxillary sinus mucosal thickening and retention cyst formation.  IMPRESSION: 1. No acute abnormality. 2. Mildly progressive atrophy and chronic small vessel white matter ischemic changes and stable old right cerebellar hemisphere lacunar infarct. 3. Mild chronic bilateral maxillary sinusitis. These results were called by telephone at the time of interpretation on 03/08/2015 at 7:16 pm to Dr. Armida Sans, who verbally acknowledged these results.   Electronically Signed   By: Claudie Revering M.D.   On: 03/08/2015 19:16   Mr Mario Small Wo Contrast  03/08/2015   CLINICAL DATA:  81 year old male code stroke with confusion, impaired gait, nausea and vomiting. Ischemic stroke 4 weeks ago treated with thrombolytics. Initial encounter.  EXAM: MRI HEAD WITHOUT  CONTRAST  MRA HEAD WITHOUT CONTRAST  TECHNIQUE: Multiplanar, multiecho pulse sequences of the brain and surrounding structures were obtained without intravenous contrast. Angiographic images of the head were obtained using MRA technique without contrast.  COMPARISON:  Head CT without contrast 1911 hours today. Brain MRI 03/21/2012  FINDINGS: MRI HEAD FINDINGS  Major intracranial vascular flow voids are stable. No restricted diffusion to  suggest acute infarction. No midline shift, mass effect, evidence of mass lesion, ventriculomegaly, extra-axial collection or acute intracranial hemorrhage. Cervicomedullary junction and pituitary are within normal limits. Cerebral volume has not significantly changed since 2013. Scattered and patchy cerebral white matter T2 and FLAIR hyperintensity also appears stable. A small chronic infarct in the inferior right cerebellum is unchanged.  Normal bone marrow signal. Negative visualized cervical spine. Visualized orbit soft tissues are within normal limits. Stable paranasal sinuses and mastoids. Visualized scalp soft tissues are within normal limits.  MRA HEAD FINDINGS  Antegrade flow in the posterior circulation with codominant distal vertebral arteries. Patent right PICA origin. Normal vertebrobasilar junction. Dominant appearing right AICA. No basilar artery stenosis. SCA and PCA origins are within normal limits. Posterior communicating arteries are diminutive or absent.  Left PCA branches are within normal limits. Moderate stenosis at the origin of the right P3 superior division with preserved distal flow.  Antegrade flow in both ICA siphons. Moderate stenosis of the right ICA cavernous segment. Both carotid termini are patent.  Dominant appearing left ACA A1 segment. Anterior communicating artery and visualized bilateral ACA branches are within normal limits.  Both MCA origins are patent. There is mild stenosis in the left M1 segment with preserved distal flow and patent left  MCA bifurcation. No major left MCA branch occlusion identified. Mild irregularity of the right M1 segment with patent right MCA bifurcation and no major right MCA branch occlusion identified.  IMPRESSION: 1.  No acute intracranial abnormality. 2. Stable appearance of chronic ischemic disease since 2013. 3. Intracranial atherosclerosis with moderate cavernous right ICA and right PCA P3 segment stenoses. No major circle of Willis branch occlusion.   Electronically Signed   By: Genevie Ann M.D.   On: 03/08/2015 21:16   Mr Brain Wo Contrast  03/08/2015   CLINICAL DATA:  81 year old male code stroke with confusion, impaired gait, nausea and vomiting. Ischemic stroke 4 weeks ago treated with thrombolytics. Initial encounter.  EXAM: MRI HEAD WITHOUT CONTRAST  MRA HEAD WITHOUT CONTRAST  TECHNIQUE: Multiplanar, multiecho pulse sequences of the brain and surrounding structures were obtained without intravenous contrast. Angiographic images of the head were obtained using MRA technique without contrast.  COMPARISON:  Head CT without contrast 1911 hours today. Brain MRI 03/21/2012  FINDINGS: MRI HEAD FINDINGS  Major intracranial vascular flow voids are stable. No restricted diffusion to suggest acute infarction. No midline shift, mass effect, evidence of mass lesion, ventriculomegaly, extra-axial collection or acute intracranial hemorrhage. Cervicomedullary junction and pituitary are within normal limits. Cerebral volume has not significantly changed since 2013. Scattered and patchy cerebral white matter T2 and FLAIR hyperintensity also appears stable. A small chronic infarct in the inferior right cerebellum is unchanged.  Normal bone marrow signal. Negative visualized cervical spine. Visualized orbit soft tissues are within normal limits. Stable paranasal sinuses and mastoids. Visualized scalp soft tissues are within normal limits.  MRA HEAD FINDINGS  Antegrade flow in the posterior circulation with codominant distal vertebral  arteries. Patent right PICA origin. Normal vertebrobasilar junction. Dominant appearing right AICA. No basilar artery stenosis. SCA and PCA origins are within normal limits. Posterior communicating arteries are diminutive or absent.  Left PCA branches are within normal limits. Moderate stenosis at the origin of the right P3 superior division with preserved distal flow.  Antegrade flow in both ICA siphons. Moderate stenosis of the right ICA cavernous segment. Both carotid termini are patent.  Dominant appearing left ACA A1 segment. Anterior communicating artery and visualized bilateral ACA  branches are within normal limits.  Both MCA origins are patent. There is mild stenosis in the left M1 segment with preserved distal flow and patent left MCA bifurcation. No major left MCA branch occlusion identified. Mild irregularity of the right M1 segment with patent right MCA bifurcation and no major right MCA branch occlusion identified.  IMPRESSION: 1.  No acute intracranial abnormality. 2. Stable appearance of chronic ischemic disease since 2013. 3. Intracranial atherosclerosis with moderate cavernous right ICA and right PCA P3 segment stenoses. No major circle of Willis branch occlusion.   Electronically Signed   By: Genevie Ann M.D.   On: 03/08/2015 21:16     EKG: Independently reviewed. Normal Sinus Rhythm rate =63; Old Anterior Infarct Changes   Assessment/Plan:   81 y.o. male with  Principal Problem:   1.    TIA (transient ischemic attack)   Telemetry Workup   Neuro Checks   Check Fasting Lipids, HbA1C   Carotid US and  2D ECHO in AM   ASA Rx   Neurology Saw in ED and Following      Active Problems:   2.   Altered mental status- due to #1, or HTN Urgency.  May Need LP   Neuro Checks     3.   Atrial fibrillation- Unable to take Anticoagulants.   Continue Diltiazem, Atenolol, and ASA Rx     4.   Hypertension   Continue Diltiazem, Atenolol, Lisinopril, HCTZ   Monitor BPs     5.   Coronary  artery disease   Continue Atenolol, Mevacor, and ASA          6.   Diabetes mellitus without complication   Hold Metformin Rx   SSI Coverage PRN   Check HbA1C.       7.   Hypothyroid   Continue Levothyroxine   Check TSH     8.   Hyperlipidemia-   Continue Statin Rx   Check Fasting Lipids       9.   Anemia- Normocytic  Indices   Anemia Panel   Send FOBT    10.   DVT Prophylaxis    Lovenox          Code Status:     FULL CODE      Family Communication:   Family at Bedside   No Family Present    Disposition Plan:    Observation Status        Time spent:  [place] [facility] Pager [phone]   If [place] Please Contact the Day Rounding Team MD for [facility]  If 7PM-7AM, Please Contact Night-Floor Coverage  [email or web address] Password [facility] 03/08/2015, 10:49 PM     ADDENDUM:   Patient was seen and examined on 03/08/2015

## 2015-03-08 NOTE — ED Notes (Signed)
MD made aware of pt BP

## 2015-03-09 DIAGNOSIS — N179 Acute kidney failure, unspecified: Secondary | ICD-10-CM | POA: Diagnosis not present

## 2015-03-09 DIAGNOSIS — I639 Cerebral infarction, unspecified: Secondary | ICD-10-CM | POA: Diagnosis not present

## 2015-03-09 DIAGNOSIS — I10 Essential (primary) hypertension: Secondary | ICD-10-CM | POA: Diagnosis not present

## 2015-03-09 DIAGNOSIS — I4891 Unspecified atrial fibrillation: Secondary | ICD-10-CM | POA: Diagnosis not present

## 2015-03-09 DIAGNOSIS — E119 Type 2 diabetes mellitus without complications: Secondary | ICD-10-CM

## 2015-03-09 DIAGNOSIS — I480 Paroxysmal atrial fibrillation: Secondary | ICD-10-CM | POA: Diagnosis not present

## 2015-03-09 DIAGNOSIS — G459 Transient cerebral ischemic attack, unspecified: Secondary | ICD-10-CM | POA: Diagnosis not present

## 2015-03-09 LAB — GLUCOSE, CAPILLARY
GLUCOSE-CAPILLARY: 128 mg/dL — AB (ref 70–99)
GLUCOSE-CAPILLARY: 165 mg/dL — AB (ref 70–99)
GLUCOSE-CAPILLARY: 169 mg/dL — AB (ref 70–99)
Glucose-Capillary: 131 mg/dL — ABNORMAL HIGH (ref 70–99)
Glucose-Capillary: 177 mg/dL — ABNORMAL HIGH (ref 70–99)
Glucose-Capillary: 102 mg/dL — ABNORMAL HIGH (ref 70–99)

## 2015-03-09 LAB — FOLATE: Folate: 17.3 ng/mL

## 2015-03-09 LAB — IRON AND TIBC
Iron: 39 ug/dL — ABNORMAL LOW (ref 42–165)
Saturation Ratios: 18 % — ABNORMAL LOW (ref 20–55)
TIBC: 222 ug/dL (ref 215–435)
UIBC: 183 ug/dL (ref 125–400)

## 2015-03-09 LAB — VITAMIN B12: Vitamin B-12: 618 pg/mL (ref 211–911)

## 2015-03-09 LAB — LIPID PANEL
CHOLESTEROL: 191 mg/dL (ref 0–200)
HDL: 41 mg/dL (ref >39)
LDL Cholesterol: 89 mg/dL (ref 0–99)
Total CHOL/HDL Ratio: 4.7 RATIO
Triglycerides: 306 mg/dL — ABNORMAL HIGH (ref <150)
VLDL: 61 mg/dL — ABNORMAL HIGH (ref 0–40)

## 2015-03-09 LAB — FERRITIN: Ferritin: 18 ng/mL — ABNORMAL LOW (ref 22–322)

## 2015-03-09 LAB — RETICULOCYTES
RBC.: 3.59 MIL/uL — ABNORMAL LOW (ref 4.22–5.81)
RETIC CT PCT: 1.4 % (ref 0.4–3.1)
Retic Count, Absolute: 50.3 10*3/uL (ref 19.0–186.0)

## 2015-03-09 MED ORDER — ATORVASTATIN CALCIUM 40 MG PO TABS
40.0000 mg | ORAL_TABLET | Freq: Every day | ORAL | Status: DC
  Administered 2015-03-09: 40 mg via ORAL
  Filled 2015-03-09: qty 1

## 2015-03-09 MED ORDER — SODIUM CHLORIDE 0.9 % IV SOLN: INTRAVENOUS | Status: DC

## 2015-03-09 MED ORDER — SODIUM CHLORIDE 0.9 % IV SOLN
INTRAVENOUS | Status: DC
  Administered 2015-03-09: 75 mL/h via INTRAVENOUS
  Administered 2015-03-09: 13:00:00 via INTRAVENOUS

## 2015-03-09 MED ORDER — APIXABAN 2.5 MG PO TABS
2.5000 mg | ORAL_TABLET | Freq: Two times a day (BID) | ORAL | Status: DC
  Administered 2015-03-09 – 2015-03-10 (×2): 2.5 mg via ORAL
  Filled 2015-03-09 (×2): qty 1

## 2015-03-09 NOTE — Progress Notes (Signed)
ANTICOAGULATION CONSULT NOTE - Initial Consult  Pharmacy Consult for apixaban Indication: atrial fibrillation  Allergies  Allergen Reactions  . Epinephrine Shortness Of Breath  . Amiodarone Nausea And Vomiting    Severe nausea and vomiting  . Tramadol Other (See Comments)    Caused aggression  . Xarelto [Rivaroxaban] Other (See Comments)    Severe bleeding  . Dabigatran Etexilate Mesylate Nausea And Vomiting    Reaction to Pradaxa    Patient Measurements: Height: 5\' 11"  (180.3 cm) Weight: 220 lb (99.791 kg) IBW/kg (Calculated) : 75.3  Vital Signs: Temp: 98.1 F (36.7 C) (04/17 0900) Temp Source: Oral (04/17 0900) BP: 140/55 mmHg (04/17 0900) Pulse Rate: 61 (04/17 0900)  Labs:  Recent Labs  03/08/15 1855 03/08/15 1908  HGB 12.0* 12.2*  HCT 36.7* 36.0*  PLT 146*  --   APTT 32  --   LABPROT 12.8  --   INR 0.96  --   CREATININE 1.46* 1.40*    Estimated Creatinine Clearance: 51.5 mL/min (by C-G formula based on Cr of 1.4).   Medical History: Past Medical History  Diagnosis Date  . CVA (cerebral infarction)   . Coronary artery disease   . Hypertension   . Thyroid disease   . Hypothyroid   . Diabetes mellitus without complication   . Atrial fibrillation    Assessment: 3 yom presented to the hospital as a code stroke. Known history of afib but has been unable to tolerate xarelto (due to mouth bleeding) and pradaxa due nausea and vomiting. Will attempt apixaban (low dose requested by MD). Pt received a dose of lovenox prophylaxis this AM so will start apixaban tonight to avoid potential adverse effects. Pt is also on aspirin.   Goal of Therapy:  Therapeutic anticoagulation   Plan:  - DC lovenox - Apixaban 2.5mg  PO BID  - F/u renal fxn, S&S of bleeding - MD - Please evaluate if aspirin should continue with apixaban  *Pharmacy will sign-off and follow peripherally. Thank you for the consult!  Kiaja Shorty, Rande Lawman 03/09/2015,11:26 AM

## 2015-03-09 NOTE — Progress Notes (Signed)
  Echocardiogram 2D Echocardiogram has been performed.  Mario Small 03/09/2015, 12:49 PM

## 2015-03-09 NOTE — Progress Notes (Signed)
Pt arrived on unit 2315 hrs, oriented to unit and room equipment, A&O x3, C/O headache 5/10, NIH 3, MD notified of arrival, admission orders being implemented.

## 2015-03-09 NOTE — Evaluation (Signed)
Physical Therapy Evaluation Patient Details Name: Mario Small MRN: [id number] DOB: 04/02/1933 Today's Date: 03/09/2015   History of Present Illness  Mario Small is a 81 y.o. male with a history of CVA (in 2013), HTN, CAD, Paroxsymal Atrial Fibrillation ( with Adverse Effects on Anticoagulants), HTN, DM2 who presents to the ED with complaints of increased confusion, with difficulty speaking, and difficulty walking. Principle problem of TIA.  Clinical Impression  Pt admitted with above complications. Pt currently with functional limitations due to the deficits listed below (see PT Problem List). Demonstrates mild balance impairment and will greatly benefit from use of a cane for stability and follow up with outpatient physical therapy. Safely completed stair training today and patient has strong family support at home. All education reviewed. Will follow for progression of functional independence until d/c.       Follow Up Recommendations Outpatient PT    Equipment Recommendations  Other (comment) (tub bench)    Recommendations for Other Services       Precautions / Restrictions Precautions Precautions: Fall Restrictions Weight Bearing Restrictions: No      Mobility  Bed Mobility               General bed mobility comments: sitting EOB  Transfers Overall transfer level: Needs assistance Equipment used: None Transfers: Sit to/from Stand Sit to Stand: Min guard         General transfer comment: close guard for safety. moderate sway upon standing, uses back of knees for support on bed initially. States some dizziness which resolved.  Ambulation/Gait Ambulation/Gait assistance: Min guard Ambulation Distance (Feet): 180 Feet Assistive device:  (pushing IV pole) Gait Pattern/deviations: Step-through pattern;Decreased stride length;Drifts right/left;Trunk flexed Gait velocity: decreased   General Gait Details: Small steps, moderate sway at times when he stops.  Relies on IV pole for support. Reaches for furniture when not holding IV pole. No loss of balance requiring PT assistance. VC for forward gaze intermittently. Difficulty with high marching.  Stairs Stairs: Yes Stairs assistance: Min guard Stair Management: No rails;One rail Right;Step to pattern;Forwards Number of Stairs: 2 (x3) General stair comments: Practiced with instructions for sequencing and min guard for safety. Requires use of rail which he states he holds his door at home. Moderately difficult task for pt but did not lose balance to the point that he needed physical assist from PT.  Wheelchair Mobility    Modified Rankin (Stroke Patients Only) Modified Rankin (Stroke Patients Only) Pre-Morbid Rankin Score: Slight disability Modified Rankin: Moderately severe disability     Balance Overall balance assessment: Needs assistance Sitting-balance support: No upper extremity supported;Feet supported Sitting balance-Leahy Scale: Good     Standing balance support: No upper extremity supported Standing balance-Leahy Scale: Fair                 High Level Balance Comments: Rhomberg stance initially pt had to use bed for support but progressed to no UE support x30 sec with a moderate amount of sway noted.             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 Some LUE ataxia with pro/sup)           Lower Extremity Assessment: LLE deficits/detail (strength grossly equal)   LLE Deficits / Details: reports mild numbness throughout LLE. States he has hx of peripheral neuropathy bil. normal heal-to-shin test bil.     Communication   Communication: No difficulties  Cognition Arousal/Alertness: Awake/alert Behavior During Therapy: WFL for tasks assessed/performed Overall Cognitive Status: Impaired/Different from baseline Area of Impairment: Problem solving             Problem Solving: Difficulty sequencing;Slow processing General Comments: Reports some variable answers concerning Hx compared to family's report.    General Comments General comments (skin integrity, edema, etc.): Discussed d/c recommendations with patient and family. Encouraged patient to use RW and cane as needed at home. Family to guard patient on stairs. Discussed importance of medication compliance, exercise, diet, and awareness of s/s of CVA/TIA.     Exercises        Assessment/Plan    PT Assessment Patient needs continued PT services  PT Diagnosis Difficulty walking;Abnormality of gait   PT Problem List Decreased activity tolerance;Decreased balance;Decreased mobility;Decreased coordination;Decreased cognition;Decreased knowledge of use of DME  PT Treatment Interventions DME instruction;Gait training;Stair training;Functional mobility training;Therapeutic activities;Therapeutic exercise;Balance training;Neuromuscular re-education;Patient/family education;Cognitive remediation   PT Goals (Current goals can be found in the Care Plan section) Acute Rehab PT Goals Patient Stated Goal: Go home PT Goal Formulation: With patient/family Time For Goal Achievement: 03/23/15 Potential to Achieve Goals: Good    Frequency Min 4X/week   Barriers to discharge         Co-evaluation               End of Session Equipment Utilized During Treatment: Gait belt Activity Tolerance: Patient tolerated treatment well Patient left: in bed;with call bell/phone within reach;with family/visitor present (sitting EOB) Nurse Communication: Mobility status    Functional Assessment Tool Used: clinical observation Functional Limitation: Mobility: Walking and moving around Mobility: Walking and Moving Around Current Status ([id number]): At least 1 percent but less than 20 percent impaired, limited or restricted Mobility: Walking and Moving Around Goal Status [phone]): At least 1 percent but less than 20 percent impaired, limited or restricted    Time: 0856-0920 PT Time Calculation (min) (ACUTE ONLY): 24 min   Charges:   PT Evaluation $Initial PT Evaluation Tier I: 1 Procedure PT Treatments $Gait Training: 8-22 mins   PT G Codes:   PT G-Codes **NOT FOR INPATIENT CLASS** Functional Assessment Tool Used: clinical observation Functional Limitation: Mobility: Walking and moving around Mobility: Walking and Moving Around Current Status (O1308): At least 1 percent but less than 20 percent impaired, limited or restricted Mobility: Walking and Moving Around Goal Status [phone]): At least 1 percent but less than 20 percent impaired, limited or restricted    Ellouise Newer 03/09/2015, 10:47 AM Elayne Snare, [place]

## 2015-03-09 NOTE — Progress Notes (Signed)
STROKE TEAM PROGRESS NOTE   HISTORY Mario Small is a 81 y.o. male with a past medical history significant for HTN, DM, CAD s/p stenting, atrial fibrillation not taking anticoagulants, ischemic stroke 4 weeks that according to EMS was treated with thrombolytics at an outside facility, brought in by EMS as a code stroke due to confusion and impaired gait. Patient lives at home and was noted by family to be increasingly confused (had been confused and forgetful since his stroke), making no sense during conversations, completely off balance. Family said that he has been falling lately. He is confused but without obvious focal weakness at this moment. Very nauseated and vomiting. NIHSS 6. CT brain was personally reviewed and showed no acute abnormality.  Date last known well: 03/08/15 Time last known well: 1 pm tPA Given: no, out of the window NIHSS: 6    SUBJECTIVE (INTERVAL HISTORY) Multiple family members present. The patient's wife gave most of the history. The patient has an appointment to see Dr. Metta Clines, a neurologist in [facility]. The patient is feeling better today. He has a history of atrial fibrillation and has had bleeding problems on Xarelto and Pradaxa in the past. Dr. Leonie Man recommended a trial of Eliquis. This most recent event may have been a TIA. Dr. Leonie Man recommends a CT angiogram if creatinine can be improved with hydration. Check  bmet in a.m.   OBJECTIVE Temp:  [98.3 F (36.8 C)-99.1 F (37.3 C)] 99 F (37.2 C) (04/17 0700) Pulse Rate:  [61-80] 64 (04/17 0700) Cardiac Rhythm:  [-] Normal sinus rhythm (04/16 2319) Resp:  [13-22] 16 (04/17 0700) BP: (137-219)/(39-148) 150/48 mmHg (04/17 0700) SpO2:  [92 %-100 %] 94 % (04/17 0700) Weight:  [97.977 kg (216 lb)-99.791 kg (220 lb)] 99.791 kg (220 lb) (04/16 2320)   Recent Labs Lab 03/08/15 1926 03/09/15 0005 03/09/15 0356  GLUCAP 182* 169* 131*    Recent Labs Lab 03/08/15 1855 03/08/15 1908  NA 140 142  K  3.9 4.0  CL 105 107  CO2 25  --   GLUCOSE 215* 218*  BUN 22 24*  CREATININE 1.46* 1.40*  CALCIUM 8.8  --     Recent Labs Lab 03/08/15 1855  AST 22  ALT 18  ALKPHOS 50  BILITOT 0.5  PROT 6.0  ALBUMIN 2.4*    Recent Labs Lab 03/08/15 1855 03/08/15 1908  WBC 5.0  --   NEUTROABS 2.9  --   HGB 12.0* 12.2*  HCT 36.7* 36.0*  MCV 86.2  --   PLT 146*  --    No results for input(s): CKTOTAL, CKMB, CKMBINDEX, TROPONINI in the last 168 hours.  Recent Labs  03/08/15 1855  LABPROT 12.8  INR 0.96    Recent Labs  03/08/15 1930  COLORURINE YELLOW  LABSPEC 1.025  PHURINE 5.5  GLUCOSEU 250*  HGBUR SMALL*  BILIRUBINUR NEGATIVE  KETONESUR NEGATIVE  PROTEINUR >300*  UROBILINOGEN 0.2  NITRITE NEGATIVE  LEUKOCYTESUR NEGATIVE    No results found for: CHOL, TRIG, HDL, CHOLHDL, VLDL, LDLCALC No results found for: HGBA1C    Component Value Date/Time   LABOPIA NONE DETECTED 03/08/2015 1930   COCAINSCRNUR NONE DETECTED 03/08/2015 1930   LABBENZ NONE DETECTED 03/08/2015 1930   AMPHETMU NONE DETECTED 03/08/2015 1930   THCU NONE DETECTED 03/08/2015 1930   LABBARB NONE DETECTED 03/08/2015 1930     Recent Labs Lab 03/08/15 1855  ETH <5    Ct Head Wo Contrast 03/08/2015    1. No acute abnormality.  2. Mildly progressive atrophy and chronic small vessel white matter ischemic changes and stable old right cerebellar hemisphere lacunar infarct. 3. Mild chronic bilateral maxillary sinusitis.     [name] 03/08/2015    1. No acute intracranial abnormality.  2. Stable appearance of chronic ischemic disease since 2013.  3. Intracranial atherosclerosis with moderate cavernous right ICA and right PCA P3 segment stenoses.  4. No major circle of Willis branch occlusion.       PHYSICAL EXAM Obese elderly Caucasian male not in distress. . Afebrile. Head is nontraumatic. Neck is supple without bruit.    Cardiac exam no murmur or gallop. Lungs are clear to  auscultation. Distal pulses are well felt. Neurological Exam ;  Awake  Alert oriented x 3. Normal speech and language.eye movements full without nystagmus.fundi were not visualized. Vision acuity and fields appear normal. Hearing is normal. Palatal movements are normal. Face symmetric. Tongue midline. Normal strength, tone, reflexes and coordination. Normal sensation. Gait deferred.     ASSESSMENT/PLAN [name] is a 81 y.o. male with history of  HTN, DM, CAD s/p stenting, atrial fibrillation not taking anticoagulants, ischemic stroke 4 weeks that according to EMS was treated with thrombolytics at an outside facility presenting with confusion and impaired gait.  He did not receive IV t-PA due to late presentation.  Vertebrobasilar TIA  Etiology cardioembolic given h/o AFIB  Resultant  improvement in deficits with mild residual speech difficulties mild dizziness.  MRI  no acute abnormality.  MRA  Intracranial atherosclerosis with moderate cavernous right ICA and right PCA P3 segment stenoses.   Carotid Doppler - pending  2D Echo pending  LDL 89  HgbA1c pending  Eliquis for VTE prophylaxis  Diet heart healthy/carb modified Room service appropriate?: Yes; Fluid consistency:: Thin  aspirin 81 mg orally every day prior to admission, now on eliquis (apixaban)  Patient counseled to be compliant with his antithrombotic medications  Ongoing aggressive stroke risk factor management  Therapy recommendations:  Outpatient physical therapy recommended  Disposition:  Pending  Hypertension  Home meds:  Tenormin, Cardizem, hydrochlorothiazide, and lisinopril  Stable   Hyperlipidemia  Home meds:  Lovastatin 80 mg daily. Pravastatin in hospital  LDL 89 , goal < 70  Change to Lipitor  Continue statin at discharge  Diabetes  HgbA1c pending, goal < 7.0  Uncontrolled  Other Stroke Risk Factors  Advanced age  Obesity, Body mass index is 30.7 kg/(m^2).   Hx  stroke/TIA  Coronary artery disease   Other Active Problems  Renal insufficiency  Mild anemia  Other Pertinent History  T[facility] for stroke 3 years ago at [facility] with minimal residual deficits  Previous bleeding problems on Pradaxa and Xarelto  PLAN  Continue IV hydration  Check be met in a.m.  CTA of head to check posterior circulation tomorrow if creatinine improved  Eliquis therapy initiated  Continue aspirin 81 mg daily for cardiac history and previous stent.    Hospital day #   Mikey Bussing PA-C [facility] Pager [phone] 03/09/2015, 12:29 PM I have personally examined this patient, reviewed notes, independently viewed imaging studies, participated in medical decision making and plan of care. I have made any additions or clarifications directly to the above note. Agree with note above.  I suspect patient has vertebrobasilar TIA or small brainstem infarct not visualized on MRI etiology likely Cardizem embolic from atrial fibrillation. He remains at risk for recurrent stroke/TIA and neurological worsening. He needs ongoing stroke  evaluation and aggressive risk factor modification. We'll need to be started on eliquis for secondary stroke prevention despite history of dental  bleeds on Xarelto in the past. Discussed with patient, wife and multiple family members and answered questions  Antony Contras, MD Medical Director [place] Pager: [phone] 03/09/2015 1:29 PM     To contact Stroke Continuity provider, please refer to [email or web address] After hours, contact General Neurology

## 2015-03-09 NOTE — Progress Notes (Signed)
UR completed 

## 2015-03-09 NOTE — Progress Notes (Signed)
VASCULAR LAB PRELIMINARY  PRELIMINARY  PRELIMINARY  PRELIMINARY  Carotid Dopplers completed.    Preliminary report:  1-39% ICA stenosis.  Vertebral artery flow is antegrade.  There is significant calcific plaque noted at the bilateral ICA origins with acoustic shadowing, which may obscure higher velocities.   Ceara Wrightson, RVT 03/09/2015, 5:18 PM

## 2015-03-09 NOTE — Progress Notes (Signed)
PATIENT DETAILS Name: Mario Small Age: 81 y.o. Sex: male Date of Birth: 08-26-1933 Admit Date: 03/08/2015 Admitting Physician Theressa Millard, MD PCP:No primary care provider on file.  Subjective: No further headache.Unsteady gait/slurred speech have all resolved.  Assessment/Plan: Principal Problem:   Suspected posterior circulation TIA (transient ischemic attack): Admitted with headache, confusion, slurred speech and unsteady gait. Thankfully all of these symptoms have resolved. MRI of the brain negative for acute CVA. Has history of atrial fibrillation, approximately 2 years back Xarelto was discontinued because of persistent mouth bleeding-and not requiring hospitalization or PRBC transfusion. Discussed with neurologist/stroke team-Dr. Leonie Man, recommended to start Eliquis. Family and patient aware of risks. Dr. Leonie Man planning to do a CT angiogram of the head/neck to evaluate posterior circulation once renal function is more improved.  Active Problems:   Paroxysmal Atrial fibrillation: Continue rate control with atenolol and Cardizem.CHADS2VASC score of at least 6, CO2 of regarding anticoagulation-started on Eliquis.    Mild acute renal failure: Hydrate overnight. Recheck electrolytes in a.m., Dr. Leonie Man planning to do a CT angiogram if creatinine better.    Hypertension: Controlled, allow some mild permissive hypertension. Continue with atenolol, and Cardizem. Continue to hold HCTZ, discontinue lisinopril for now.    Type 2 diabetes: CBGs controlled with SSRI. Continue to hold metformin. Await A1c     History of CAD: Continue with aspirin, atenolol and statin.    BPH: Continue Flomax    Hypothyroidism: Continue levothyroxine  Disposition: Remain inpatient-home once workup is complete  Antibiotics:  See below   Anti-infectives    None      DVT Prophylaxis: Eliquis  Code Status: Full code  Family Communication Daughter and granddaughter at  bedside  Procedures:  None  CONSULTS:  neurology  Time spent 40 minutes-which includes 50% of the time with face-to-face with patient/ family and coordinating care related to the above assessment and plan.  MEDICATIONS: Scheduled Meds: . allopurinol  100 mg Oral Daily  . aspirin EC  81 mg Oral Daily  . atenolol  100 mg Oral BID  . diltiazem  240 mg Oral Daily  . enoxaparin (LOVENOX) injection  30 mg Subcutaneous Q24H  . insulin aspart  0-9 Units Subcutaneous 6 times per day  . levothyroxine  25 mcg Oral QAC breakfast  . lisinopril  40 mg Oral Daily  . multivitamin with minerals  1 tablet Oral Daily  . pravastatin  40 mg Oral q1800  . tamsulosin  0.4 mg Oral QHS   Continuous Infusions: . sodium chloride     PRN Meds:.hydrALAZINE, HYDROmorphone (DILAUDID) injection, senna-docusate    PHYSICAL EXAM: Vital signs in last 24 hours: Filed Vitals:   03/09/15 0300 03/09/15 0500 03/09/15 0700 03/09/15 0900  BP: 137/39 167/60 150/48 140/55  Pulse: 62 63 64 61  Temp: 99 F (37.2 C) 99.1 F (37.3 C) 99 F (37.2 C) 98.1 F (36.7 C)  TempSrc: Oral Oral Oral Oral  Resp: 14 14 16 16   Height:      Weight:      SpO2: 96% 94% 94% 93%    Weight change:  Filed Weights   03/08/15 1922 03/08/15 2320  Weight: 97.977 kg (216 lb) 99.791 kg (220 lb)   Body mass index is 30.7 kg/(m^2).   Gen Exam: Awake and alert with clear speech.   Neck: Supple, No JVD.   Chest: B/L Clear.   CVS: S1 S2 Regular, no murmurs.  Abdomen: soft, BS +, non  tender, non distended.  Extremities: no edema, lower extremities warm to touch. Neurologic: Non Focal.   Skin: No Rash.   Wounds: N/A.    Intake/Output from previous day:  Intake/Output Summary (Last 24 hours) at 03/09/15 1119 Last data filed at 03/08/15 1933  Gross per 24 hour  Intake      0 ml  Output    100 ml  Net   -100 ml     LAB RESULTS: CBC  Recent Labs Lab 03/08/15 1855 03/08/15 1908  WBC 5.0  --   HGB 12.0* 12.2*  HCT  36.7* 36.0*  PLT 146*  --   MCV 86.2  --   MCH 28.2  --   MCHC 32.7  --   RDW 18.4*  --   LYMPHSABS 1.3  --   MONOABS 0.6  --   EOSABS 0.1  --   BASOSABS 0.0  --     Chemistries   Recent Labs Lab 03/08/15 1855 03/08/15 1908  NA 140 142  K 3.9 4.0  CL 105 107  CO2 25  --   GLUCOSE 215* 218*  BUN 22 24*  CREATININE 1.46* 1.40*  CALCIUM 8.8  --     CBG:  Recent Labs Lab 03/08/15 1926 03/09/15 0005 03/09/15 0356 03/09/15 0854  GLUCAP 182* 169* 131* 177*    GFR Estimated Creatinine Clearance: 51.5 mL/min (by C-G formula based on Cr of 1.4).  Coagulation profile  Recent Labs Lab 03/08/15 1855  INR 0.96    Cardiac Enzymes No results for input(s): CKMB, TROPONINI, MYOGLOBIN in the last 168 hours.  Invalid input(s): CK  Invalid input(s): POCBNP No results for input(s): DDIMER in the last 72 hours. No results for input(s): HGBA1C in the last 72 hours.  Recent Labs  03/09/15 0603  CHOL 191  HDL 41  LDLCALC 89  TRIG 306*  CHOLHDL 4.7   No results for input(s): TSH, T4TOTAL, T3FREE, THYROIDAB in the last 72 hours.  Invalid input(s): FREET3  Recent Labs  03/09/15 0603  RETICCTPCT 1.4   No results for input(s): LIPASE, AMYLASE in the last 72 hours.  Urine Studies No results for input(s): UHGB, CRYS in the last 72 hours.  Invalid input(s): UACOL, UAPR, USPG, UPH, UTP, UGL, UKET, UBIL, UNIT, UROB, ULEU, UEPI, UWBC, URBC, UBAC, CAST, UCOM, BILUA  MICROBIOLOGY: No results found for this or any previous visit (from the past 240 hour(s)).  RADIOLOGY STUDIES/RESULTS: Ct Head Wo Contrast  03/08/2015   CLINICAL DATA:  Altered mental status.  Stroke 6 weeks ago.  EXAM: CT HEAD WITHOUT CONTRAST  TECHNIQUE: Contiguous axial images were obtained from the base of the skull through the vertex without intravenous contrast.  COMPARISON:  Brain MR dated 03/21/2012 and head CT dated 10/08/2011.  FINDINGS: Diffusely enlarged ventricles and subarachnoid spaces.  Patchy white matter low density in both cerebral hemispheres. Old right cerebellar hemisphere lacunar infarct without significant change. No intracranial hemorrhage, mass lesion or CT evidence of acute infarction. Unremarkable bones. Inferior bilateral maxillary sinus mucosal thickening and retention cyst formation.  IMPRESSION: 1. No acute abnormality. 2. Mildly progressive atrophy and chronic small vessel white matter ischemic changes and stable old right cerebellar hemisphere lacunar infarct. 3. Mild chronic bilateral maxillary sinusitis. These results were called by telephone at the time of interpretation on 03/08/2015 at 7:16 pm to Dr. Armida Sans, who verbally acknowledged these results.   Electronically Signed   By: Claudie Revering M.D.   On: 03/08/2015 19:16   Mr Jodene Nam  Head Wo Contrast  03/08/2015   CLINICAL DATA:  81 year old male code stroke with confusion, impaired gait, nausea and vomiting. Ischemic stroke 4 weeks ago treated with thrombolytics. Initial encounter.  EXAM: MRI HEAD WITHOUT CONTRAST  MRA HEAD WITHOUT CONTRAST  TECHNIQUE: Multiplanar, multiecho pulse sequences of the brain and surrounding structures were obtained without intravenous contrast. Angiographic images of the head were obtained using MRA technique without contrast.  COMPARISON:  Head CT without contrast 1911 hours today. Brain MRI 03/21/2012  FINDINGS: MRI HEAD FINDINGS  Major intracranial vascular flow voids are stable. No restricted diffusion to suggest acute infarction. No midline shift, mass effect, evidence of mass lesion, ventriculomegaly, extra-axial collection or acute intracranial hemorrhage. Cervicomedullary junction and pituitary are within normal limits. Cerebral volume has not significantly changed since 2013. Scattered and patchy cerebral white matter T2 and FLAIR hyperintensity also appears stable. A small chronic infarct in the inferior right cerebellum is unchanged.  Normal bone marrow signal. Negative visualized cervical  spine. Visualized orbit soft tissues are within normal limits. Stable paranasal sinuses and mastoids. Visualized scalp soft tissues are within normal limits.  MRA HEAD FINDINGS  Antegrade flow in the posterior circulation with codominant distal vertebral arteries. Patent right PICA origin. Normal vertebrobasilar junction. Dominant appearing right AICA. No basilar artery stenosis. SCA and PCA origins are within normal limits. Posterior communicating arteries are diminutive or absent.  Left PCA branches are within normal limits. Moderate stenosis at the origin of the right P3 superior division with preserved distal flow.  Antegrade flow in both ICA siphons. Moderate stenosis of the right ICA cavernous segment. Both carotid termini are patent.  Dominant appearing left ACA A1 segment. Anterior communicating artery and visualized bilateral ACA branches are within normal limits.  Both MCA origins are patent. There is mild stenosis in the left M1 segment with preserved distal flow and patent left MCA bifurcation. No major left MCA branch occlusion identified. Mild irregularity of the right M1 segment with patent right MCA bifurcation and no major right MCA branch occlusion identified.  IMPRESSION: 1.  No acute intracranial abnormality. 2. Stable appearance of chronic ischemic disease since 2013. 3. Intracranial atherosclerosis with moderate cavernous right ICA and right PCA P3 segment stenoses. No major circle of Willis branch occlusion.   Electronically Signed   By: Genevie Ann M.D.   On: 03/08/2015 21:16   Mr Brain Wo Contrast  03/08/2015   CLINICAL DATA:  81 year old male code stroke with confusion, impaired gait, nausea and vomiting. Ischemic stroke 4 weeks ago treated with thrombolytics. Initial encounter.  EXAM: MRI HEAD WITHOUT CONTRAST  MRA HEAD WITHOUT CONTRAST  TECHNIQUE: Multiplanar, multiecho pulse sequences of the brain and surrounding structures were obtained without intravenous contrast. Angiographic images  of the head were obtained using MRA technique without contrast.  COMPARISON:  Head CT without contrast 1911 hours today. Brain MRI 03/21/2012  FINDINGS: MRI HEAD FINDINGS  Major intracranial vascular flow voids are stable. No restricted diffusion to suggest acute infarction. No midline shift, mass effect, evidence of mass lesion, ventriculomegaly, extra-axial collection or acute intracranial hemorrhage. Cervicomedullary junction and pituitary are within normal limits. Cerebral volume has not significantly changed since 2013. Scattered and patchy cerebral white matter T2 and FLAIR hyperintensity also appears stable. A small chronic infarct in the inferior right cerebellum is unchanged.  Normal bone marrow signal. Negative visualized cervical spine. Visualized orbit soft tissues are within normal limits. Stable paranasal sinuses and mastoids. Visualized scalp soft tissues are within normal limits.  MRA  HEAD FINDINGS  Antegrade flow in the posterior circulation with codominant distal vertebral arteries. Patent right PICA origin. Normal vertebrobasilar junction. Dominant appearing right AICA. No basilar artery stenosis. SCA and PCA origins are within normal limits. Posterior communicating arteries are diminutive or absent.  Left PCA branches are within normal limits. Moderate stenosis at the origin of the right P3 superior division with preserved distal flow.  Antegrade flow in both ICA siphons. Moderate stenosis of the right ICA cavernous segment. Both carotid termini are patent.  Dominant appearing left ACA A1 segment. Anterior communicating artery and visualized bilateral ACA branches are within normal limits.  Both MCA origins are patent. There is mild stenosis in the left M1 segment with preserved distal flow and patent left MCA bifurcation. No major left MCA branch occlusion identified. Mild irregularity of the right M1 segment with patent right MCA bifurcation and no major right MCA branch occlusion identified.   IMPRESSION: 1.  No acute intracranial abnormality. 2. Stable appearance of chronic ischemic disease since 2013. 3. Intracranial atherosclerosis with moderate cavernous right ICA and right PCA P3 segment stenoses. No major circle of Willis branch occlusion.   Electronically Signed   By: Genevie Ann M.D.   On: 03/08/2015 21:16    Oren Binet, MD  [facility] Pager:[phone]  If 7PM-7AM, please contact night-coverage [email or web address] Password [email or web address] 03/09/2015, 11:19 AM

## 2015-03-10 ENCOUNTER — Encounter (HOSPITAL_COMMUNITY): Payer: Self-pay | Admitting: *Deleted

## 2015-03-10 DIAGNOSIS — G459 Transient cerebral ischemic attack, unspecified: Secondary | ICD-10-CM | POA: Diagnosis not present

## 2015-03-10 DIAGNOSIS — N179 Acute kidney failure, unspecified: Secondary | ICD-10-CM | POA: Diagnosis not present

## 2015-03-10 DIAGNOSIS — I480 Paroxysmal atrial fibrillation: Secondary | ICD-10-CM | POA: Diagnosis not present

## 2015-03-10 DIAGNOSIS — I10 Essential (primary) hypertension: Secondary | ICD-10-CM | POA: Diagnosis not present

## 2015-03-10 LAB — BASIC METABOLIC PANEL
ANION GAP: 7 (ref 5–15)
BUN: 17 mg/dL (ref 6–23)
CO2: 28 mmol/L (ref 19–32)
Calcium: 8.9 mg/dL (ref 8.4–10.5)
Chloride: 107 mmol/L (ref 96–112)
Creatinine, Ser: 1.31 mg/dL (ref 0.50–1.35)
GFR calc non Af Amer: 50 mL/min — ABNORMAL LOW (ref >90)
GFR, EST AFRICAN AMERICAN: 58 mL/min — AB (ref >90)
Glucose, Bld: 174 mg/dL — ABNORMAL HIGH (ref 70–99)
Potassium: 4.3 mmol/L (ref 3.5–5.1)
Sodium: 142 mmol/L (ref 135–145)

## 2015-03-10 LAB — GLUCOSE, CAPILLARY
GLUCOSE-CAPILLARY: 119 mg/dL — AB (ref 70–99)
GLUCOSE-CAPILLARY: 133 mg/dL — AB (ref 70–99)
Glucose-Capillary: 129 mg/dL — ABNORMAL HIGH (ref 70–99)
Glucose-Capillary: 154 mg/dL — ABNORMAL HIGH (ref 70–99)

## 2015-03-10 LAB — HEMOGLOBIN A1C
Hgb A1c MFr Bld: 7.5 % — ABNORMAL HIGH (ref 4.8–5.6)
MEAN PLASMA GLUCOSE: 169 mg/dL

## 2015-03-10 MED ORDER — ATORVASTATIN CALCIUM 40 MG PO TABS: 40.0000 mg | ORAL_TABLET | Freq: Every day | ORAL | Status: DC

## 2015-03-10 MED ORDER — APIXABAN 2.5 MG PO TABS: 2.5000 mg | ORAL_TABLET | Freq: Two times a day (BID) | ORAL | Status: AC

## 2015-03-10 NOTE — Evaluation (Signed)
Occupational Therapy Evaluation/ Discharge     Patient Details Name: Mario Small MRN: [id number] DOB: July 24, 1933 Today's Date: 03/10/2015    History of Present Illness Mario Small is a 81 y.o. male with a history of CVA (in 2013), HTN, CAD, Paroxsymal Atrial Fibrillation ( with Adverse Effects on Anticoagulants), HTN, DM2 who presents to the ED with complaints of increased confusion, with difficulty speaking, and difficulty walking. Principle problem of TIA.   Clinical Impression   Patient evaluated by Occupational Therapy with no further acute OT needs identified. All education has been completed and the patient has no further questions.PT demonstrates fall risk with bathroom transfers. Pt needs tub bench that will be provided by family. See below for any follow-up Occupational Therapy or equipment needs. OT to sign off. Thank you for referral.      Follow Up Recommendations  Outpatient OT    Equipment Recommendations  Other (comment) (family can provided)    Recommendations for Other Services       Precautions / Restrictions Precautions Precautions: Fall      Mobility Bed Mobility               General bed mobility comments: EOB   Transfers Overall transfer level: Needs assistance   Transfers: Sit to/from Stand Sit to Stand: Supervision              Balance Overall balance assessment: Needs assistance         Standing balance support: No upper extremity supported;During functional activity Standing balance-Leahy Scale: Fair                              ADL Overall ADL's : Needs assistance/impaired Eating/Feeding: Independent                               Tub/ Shower Transfer: Min guard   Functional mobility during ADLs: Min guard General ADL Comments: Pt with decr gait velocity from baseline. pt demonstrates fall risk with tub transfer. Pt with strong ankle strategies requires. Pt educated on fall risk in bathroom  and will have family assistance. Pt without any fine motor changes.     Vision     Perception     Praxis      Pertinent Vitals/Pain Pain Assessment: No/denies pain     Hand Dominance Right   Extremity/Trunk Assessment Upper Extremity Assessment Upper Extremity Assessment: Overall WFL for tasks assessed   Lower Extremity Assessment Lower Extremity Assessment: Defer to PT evaluation   Cervical / Trunk Assessment Cervical / Trunk Assessment: Normal   Communication Communication Communication: No difficulties   Cognition Arousal/Alertness: Awake/alert Behavior During Therapy: WFL for tasks assessed/performed                       General Comments       Exercises       Shoulder Instructions      Home Living Family/patient expects to be discharged to:: Private residence Living Arrangements: Spouse/significant other Available Help at Discharge: Family;Available 24 hours/day Type of Home: House Home Access: Stairs to enter [employer] of Steps: 2 Entrance Stairs-Rails: None Home Layout: One level     Bathroom Shower/Tub: Teacher, early years/pre: Standard     Home Equipment: Environmental consultant - 2 wheels;Cane - single point   Additional Comments: has tub bench that can borrow  from mother in law      Prior Functioning/Environment Level of Independence: Independent with assistive device(s)        Comments: used cane occasionally. Engineer, building services for lawn    OT Diagnosis: Generalized weakness   OT Problem List:     OT Treatment/Interventions:      OT Goals(Current goals can be found in the care plan section) Acute Rehab OT Goals Patient Stated Goal: Go home  OT Frequency:     Barriers to D/C:            Co-evaluation              End of Session Equipment Utilized During Treatment: Rolling walker Nurse Communication: Mobility status;Precautions  Activity Tolerance: Patient tolerated treatment well Patient left: in  bed;with call bell/phone within reach;with family/visitor present   Time: 8208-1388 OT Time Calculation (min): 24 min Charges:  OT General Charges $OT Visit: 1 Procedure OT Evaluation $Initial OT Evaluation Tier I: 1 Procedure OT Treatments $Self Care/Home Management : 8-22 mins G-Codes:    Parke Poisson B 2015-04-09, 11:06 AM Pager: [phone]

## 2015-03-10 NOTE — Progress Notes (Signed)
OT NOTE  G CODE   03/10/15 1000  OT G-codes **NOT FOR INPATIENT CLASS**  Functional Assessment Tool Used clinical judgement  Functional Limitation Self care  Self Care Current Status [phone]) CI  Self Care Goal Status ([id number]) CI  Self Care Discharge Status [phone]) CI   Jeri Modena   OTR/L Pager: [phone] Office: [phone] .

## 2015-03-10 NOTE — Progress Notes (Signed)
Speech Language Pathology  Patient Details Name: ACEY WOODFIELD MRN: [id number] DOB: [birth year] Today's Date: 03/10/2015 Time:  -      Pt discharging from unit at this time.     Orbie Pyo [place] [phone]

## 2015-03-10 NOTE — Progress Notes (Signed)
STROKE TEAM PROGRESS NOTE   HISTORY Mario Small is a 81 y.o. male with a past medical history significant for HTN, DM, CAD s/p stenting, atrial fibrillation not taking anticoagulants, ischemic stroke 4 weeks that according to EMS was treated with thrombolytics at an outside facility, brought in by EMS as a code stroke due to confusion and impaired gait. Patient lives at home and was noted by family to be increasingly confused (had been confused and forgetful since his stroke), making no sense during conversations, completely off balance. Family said that he has been falling lately. He is confused but without obvious focal weakness at this moment. Very nauseated and vomiting. LKW 03/08/2015 at 1300. NIHSS 6. CT brain reviewed and showed no acute abnormality. tPA not given as out of the window.    SUBJECTIVE (INTERVAL HISTORY) Wife and family at the bedside. Plans for discharge today if safe to ambulate.    OBJECTIVE Temp:  [98.1 F (36.7 C)-98.4 F (36.9 C)] 98.2 F (36.8 C) (04/18 0518) Pulse Rate:  [60-67] 67 (04/18 0518) Cardiac Rhythm:  [-] Normal sinus rhythm (04/17 2030) Resp:  [16-20] 20 (04/18 0518) BP: (138-170)/(51-88) 160/72 mmHg (04/18 0518) SpO2:  [93 %-100 %] 96 % (04/18 0518)   Recent Labs Lab 03/09/15 1632 03/09/15 1955 03/09/15 2359 03/10/15 0357 03/10/15 0745  GLUCAP 128* 165* 119* 129* 154*    Recent Labs Lab 03/08/15 1855 03/08/15 1908  NA 140 142  K 3.9 4.0  CL 105 107  CO2 25  --   GLUCOSE 215* 218*  BUN 22 24*  CREATININE 1.46* 1.40*  CALCIUM 8.8  --     Recent Labs Lab 03/08/15 1855  AST 22  ALT 18  ALKPHOS 50  BILITOT 0.5  PROT 6.0  ALBUMIN 2.4*    Recent Labs Lab 03/08/15 1855 03/08/15 1908  WBC 5.0  --   NEUTROABS 2.9  --   HGB 12.0* 12.2*  HCT 36.7* 36.0*  MCV 86.2  --   PLT 146*  --    No results for input(s): CKTOTAL, CKMB, CKMBINDEX, TROPONINI in the last 168 hours.  Recent Labs  03/08/15 1855  LABPROT 12.8   INR 0.96    Recent Labs  03/08/15 1930  COLORURINE YELLOW  LABSPEC 1.025  PHURINE 5.5  GLUCOSEU 250*  HGBUR SMALL*  BILIRUBINUR NEGATIVE  KETONESUR NEGATIVE  PROTEINUR >300*  UROBILINOGEN 0.2  NITRITE NEGATIVE  LEUKOCYTESUR NEGATIVE       Component Value Date/Time   CHOL 191 03/09/2015 0603   TRIG 306* 03/09/2015 0603   HDL 41 03/09/2015 0603   CHOLHDL 4.7 03/09/2015 0603   VLDL 61* 03/09/2015 0603   LDLCALC 89 03/09/2015 0603   No results found for: HGBA1C    Component Value Date/Time   LABOPIA NONE DETECTED 03/08/2015 1930   COCAINSCRNUR NONE DETECTED 03/08/2015 1930   LABBENZ NONE DETECTED 03/08/2015 1930   AMPHETMU NONE DETECTED 03/08/2015 1930   THCU NONE DETECTED 03/08/2015 1930   LABBARB NONE DETECTED 03/08/2015 1930     Recent Labs Lab 03/08/15 1855  ETH <5    Ct Head Wo Contrast 03/08/2015    1. No acute abnormality.  2. Mildly progressive atrophy and chronic small vessel white matter ischemic changes and stable old right cerebellar hemisphere lacunar infarct. 3. Mild chronic bilateral maxillary sinusitis.   Mri & Mra Head Wo Contrast 03/08/2015    1. No acute intracranial abnormality.  2. Stable appearance of chronic ischemic disease since 2013.  3.  Intracranial atherosclerosis with moderate cavernous right ICA and right PCA P3 segment stenoses.  4. No major circle of Willis branch occlusion.     Carotid Doppler  1-39% ICA stenosis. Vertebral artery flow is antegrade. There is significant calcific plaque noted at the bilateral ICA origins with acoustic shadowing, which may obscure higher velocities.    PHYSICAL EXAM Obese elderly Caucasian male not in distress. . Afebrile. Head is nontraumatic. Neck is supple without bruit.    Cardiac exam no murmur or gallop. Lungs are clear to auscultation. Distal pulses are well felt. Neurological Exam ;  Awake  Alert oriented x 3. Normal speech and language.eye movements full without nystagmus.fundi were  not visualized. Vision acuity and fields appear normal. Hearing is normal. Palatal movements are normal. Face symmetric. Tongue midline. Normal strength, tone, reflexes and coordination. Normal sensation. Gait deferred.   ASSESSMENT/PLAN Mario Small is a 81 y.o. male with history of  HTN, DM, CAD s/p stenting, atrial fibrillation not taking anticoagulants, ischemic stroke 4 weeks that according to EMS was treated with thrombolytics at an outside facility presenting with confusion and impaired gait.  He did not receive IV t-PA due to late presentation.  Vertebrobasilar TIA vs stroke not seen on MRI, etiology cardioembolic given h/o AFIB  Resultant  improvement in deficits with mild residual speech difficulties mild dizziness.  MRI  no acute abnormality.  MRA  Intracranial atherosclerosis with moderate cavernous right ICA and right PCA P3 segment stenoses.   Given continued elevated creatinine, will hold CTA to check posterior circulation    Carotid Doppler No significant stenosis   2D Echo pending  Eliquis for VTE prophylaxis Diet heart healthy/carb modified Room service appropriate?: Yes; Fluid consistency:: Thin  aspirin 81 mg orally every day prior to admission, has had hx of dental bleeds on Xarelto and nausea and vomiting on Pradaxa in the past. Now trialing Eliquis. Changed to eliquis (apixaban). Continue aspirin 81 mg daily for cardiac history and previous stent.  Patient counseled to be compliant with his antithrombotic medications  Ongoing aggressive stroke risk factor management  Therapy recommendations:  Outpatient physical therapy recommended  Disposition:  Home with OP therapies Patient has a 10-15% risk of having another stroke over the next year, the highest risk is within 2 weeks of the most recent stroke/TIA (risk of having a stroke following a stroke or TIA is the same). Ongoing risk factor control by Primary Care Physician Follow-up Stroke Clinic at  [facility] with Dr. Antony Contras in 2 months, order placed.  Atrial Fibrillation  bleeding problems on Xarelto and Pradaxa in the past. Dr. Leonie Man recommended a trial of Eliquis, which has been stasrted    Hypertension  Home meds:  Tenormin, Cardizem, hydrochlorothiazide, and lisinopril  Stable  Hyperlipidemia  Home meds:  Lovastatin 80 mg daily. Pravastatin in hospital  LDL 89 , goal < 70  Changed to Lipitor 40  Continue statin at discharge  Diabetes  HgbA1c pending, goal < 7.0  Other Stroke Risk Factors  Advanced age  Obesity, Body mass index is 30.7 kg/(m^2).   Hx stroke/TIA - TPA for stroke 3 years ago at [facility] with minimal residual deficits  Coronary artery disease  Other Active Problems  Renal insufficiency  Mild anemia  Radene [facility] See Amion for Pager information 03/10/2015 9:10 AM   I have personally examined this patient, reviewed notes, independently viewed imaging studies, participated in medical decision making and plan of care. I  have made any additions or clarifications directly to the above note. Agree with note above. Stroke service will sign off. Follow-up as an outpatient in stroke clinic in 2 months. Currently call for questions. Discussed with Dr. Sloan Leiter today and family and answered questions  Antony Contras, MD Medical Director [place] Pager: [phone] 03/10/2015 1:24 PM  To contact Stroke Continuity provider, please refer to [email or web address] After hours, contact General Neurology

## 2015-03-10 NOTE — Progress Notes (Signed)
Benefits check results:   03/10/2015 Called [facility] at [phone]. Talked to CSR. ELIQUIS is covered as a TIER 3 Medication. No Prior Authorization required. Retail Pharmacy co-payment will be $47.00. [facility] preferred retail pharmacies are [facility] and [facility].

## 2015-03-10 NOTE — Progress Notes (Signed)
CARE MANAGEMENT NOTE 03/10/2015  Patient:  Mario Small, Mario Small   Account Number:  [id number]  Date Initiated:  03/10/2015  Documentation initiated by:  Lorne Skeens  Subjective/Objective Assessment:   Patient was admitted for TIA work-up. Lives at home with spouse.     Action/Plan:   Will follow for discharge needs pending PT/OT evals and physician orders.   Anticipated DC Date:  03/10/2015   Anticipated DC Plan:  [place]  CM consult  Medication Assistance      Choice offered to / List presented to:             Status of service:  Completed, signed off Medicare Important Message given?  NO (If response is "NO", the following Medicare IM given date fields will be blank) Date Medicare IM given:   Medicare IM given by:   Date Additional Medicare IM given:   Additional Medicare IM given by:    Discharge Disposition:  HOME/SELF CARE  Per UR Regulation:  Reviewed for med. necessity/level of care/duration of stay  If discussed at [place] of Stay Meetings, dates discussed:    Comments:  03/10/15 [place], MSN, CM- Benefits check results were discussed with patient, who states that he is able to afford Eliquis.  Patient was given a 30 day free card to be used at discharge.  Patient states that his PCP is Dr Truman Hayward at [facility] in [place].    03/10/2015 Called [facility] at [phone]. Talked to CSR. ELIQUIS is covered as a TIER 3 Medication. No Prior Authorization required. Retail Pharmacy co-payment will be $47.00. [facility] preferred retail pharmacies are [facility] and [facility].

## 2015-03-10 NOTE — Progress Notes (Signed)
Physical Therapy Treatment Patient Details Name: Mario Small MRN: [id number] DOB: 04-29-1933 Today's Date: 03/10/2015    History of Present Illness [name] is a 81 y.o. male with a history of CVA (in 2013), HTN, CAD, Paroxsymal Atrial Fibrillation ( with Adverse Effects on Anticoagulants), HTN, DM2 who presents to the ED with complaints of increased confusion, with difficulty speaking, and difficulty walking. Principle problem of TIA.    PT Comments    Pt demonstrated significant improvement in functional mobility today.  Pt able to ambulate >550 feet with no AD and min guard, and scored a 20 on the DGI, indicating decreased risk of fall (<19 indicates high fall risk).  Pt was educated on the benefits of continuing with outpatient therapy to improve strength and balance and decreasing fall risk.   Current plan to d/c home with outpatient therapy remains appropriate once medically cleared.    Follow Up Recommendations  Outpatient PT;Supervision - Intermittent     Equipment Recommendations  Other (comment) (tub bench)    Recommendations for Other Services       Precautions / Restrictions Precautions Precautions: Fall Precaution Comments: Provided exercises to improve balance for HEP Restrictions Weight Bearing Restrictions: No    Mobility  Bed Mobility               General bed mobility comments: Pt received sitting EOB  Transfers Overall transfer level: Needs assistance Equipment used: None Transfers: Sit to/from Stand Sit to Stand: Supervision         General transfer comment: Pt able to demonstrate safe sit ot stand transfers, no dizzinesss/sway noted  Ambulation/Gait Ambulation/Gait assistance: Min guard Ambulation Distance (Feet): 550 Feet Assistive device: None Gait Pattern/deviations: Step-through pattern Gait velocity: decreased Gait velocity interpretation: Below normal speed for age/gender General Gait Details: Pt able to ambulate with no  UE support today and perform higher level balance activities with no LOB   Stairs Stairs: Yes Stairs assistance: Min guard Stair Management: Two rails;Alternating pattern Number of Stairs: 2 ((x3)) General stair comments: Demonstrated safe stair navigation with no v/c's and only min guard/supervision, still used hand rails, but stated that he feels comfortable using his door at home  Wheelchair Mobility    Modified Rankin (Stroke Patients Only) Modified Rankin (Stroke Patients Only) Pre-Morbid Rankin Score: No significant disability Modified Rankin: Slight disability     Balance Overall balance assessment: Needs assistance         Standing balance support: No upper extremity supported;During functional activity Standing balance-Leahy Scale: Good Standing balance comment: Pt able to perform DGI and pick up box off of ground without LOB               High Level Balance Comments: Pt had difficulty with Rhomberg stance and SLS exercises (marching, hip abduction), especially balancing on the L     Cognition Arousal/Alertness: Awake/alert Behavior During Therapy: WFL for tasks assessed/performed Overall Cognitive Status: Within Functional Limits for tasks assessed                 General Comments: No cognitive deficits noticed during today's session    Exercises      General Comments General comments (skin integrity, edema, etc.): Discussed benefit of continued outpatient therapy to decrease future fall risk      Pertinent Vitals/Pain Pain Assessment: No/denies pain    Home Living                      Prior  Function            PT Goals (current goals can now be found in the care plan section) Acute Rehab PT Goals Patient Stated Goal: Return home Progress towards PT goals: Progressing toward goals    Frequency  Min 4X/week    PT Plan Current plan remains appropriate    Co-evaluation             End of Session Equipment Utilized  During Treatment: Gait belt Activity Tolerance: Patient tolerated treatment well Patient left: in chair;with call bell/phone within reach;with family/visitor present     Time: 4492-0100 PT Time Calculation (min) (ACUTE ONLY): 19 min  Charges:  $Gait Training: 8-22 mins                    G Codes:      Mickie Kozikowski 17-Mar-2015, 4:43 PM  Lucas Mallow, SPT (student physical therapist) Office phone: [phone]

## 2015-03-10 NOTE — Discharge Summary (Signed)
PATIENT DETAILS Name: Mario Small Age: 81 y.o. Sex: male Date of Birth: 1933-10-16 MRN: [id number]. Admitting Physician: Theressa Millard, MD [name], MD  Admit Date: 03/08/2015 Discharge date: 03/10/2015  Recommendations for Outpatient Follow-up:  1. Ensure follow-up with cardiology to see if we can discontinue aspirin, as patient now on Eliquis 2. New medication: Eliquis 3. Please check electrolytes and CBC in 1 week 4. A1c pending at the time of discharge-please follow  PRIMARY DISCHARGE DIAGNOSIS:  Principal Problem:   TIA (transient ischemic attack) Active Problems:   Altered mental status   Hypertension   Coronary artery disease   Diabetes mellitus without complication   Hypothyroid   Atrial fibrillation   Anemia   Stroke with cerebral ischemia   Acute renal failure syndrome      PAST MEDICAL HISTORY: Past Medical History  Diagnosis Date  . CVA (cerebral infarction)   . Coronary artery disease   . Hypertension   . Thyroid disease   . Hypothyroid   . Diabetes mellitus without complication   . Atrial fibrillation     DISCHARGE MEDICATIONS: Current Discharge Medication List    START taking these medications   Details  apixaban (ELIQUIS) 2.5 MG TABS tablet Take 1 tablet (2.5 mg total) by mouth 2 (two) times daily. Qty: 60 tablet, Refills: 1      CONTINUE these medications which have NOT CHANGED   Details  acetaminophen (TYLENOL) 325 MG tablet Take 325-650 mg by mouth every 6 (six) hours as needed (pain).    allopurinol (ZYLOPRIM) 100 MG tablet Take 100 mg by mouth daily. Refills: 12    aspirin EC 81 MG tablet Take 81 mg by mouth daily.     atenolol (TENORMIN) 100 MG tablet Take 100 mg by mouth 2 (two) times daily.     diltiazem (CARDIZEM CD) 240 MG 24 hr capsule Take 240 mg by mouth daily. Refills: 5    hydrochlorothiazide (HYDRODIURIL) 25 MG tablet Take 25 mg by mouth daily as needed (swelling/ fluid build up).     levothyroxine  (SYNTHROID, LEVOTHROID) 25 MCG tablet Take 25 mcg by mouth daily before breakfast.  Refills: 11    lisinopril (PRINIVIL,ZESTRIL) 40 MG tablet Take 40 mg by mouth daily.     lovastatin (MEVACOR) 40 MG tablet Take 80 mg by mouth at bedtime.  Refills: 5    metFORMIN (GLUCOPHAGE) 850 MG tablet Take 850 mg by mouth 2 (two) times daily with a meal. With breakfast and supper    Multiple Vitamin (MULTIVITAMIN WITH MINERALS) TABS tablet Take 1 tablet by mouth daily.    tamsulosin (FLOMAX) 0.4 MG CAPS capsule Take 0.4 mg by mouth at bedtime. Refills: 12    Testosterone 20.25 MG/1.25GM (1.62%) GEL Place 81 mg onto the skin daily. 4 pumps daily/ apply to inner thighs        ALLERGIES:   Allergies  Allergen Reactions  . Epinephrine Shortness Of Breath  . Amiodarone Nausea And Vomiting    Severe nausea and vomiting  . Tramadol Other (See Comments)    Caused aggression  . Xarelto [Rivaroxaban] Other (See Comments)    Severe bleeding  . Dabigatran Etexilate Mesylate Nausea And Vomiting    Reaction to Pradaxa    BRIEF HPI:  See H&P, Labs, Consult and Test reports for all details in brief, patient is a 81 year old Caucasian male with history of prior CVA, paroxysmal atrial fibrillation (previously was on Xarelto) was admitted for evaluation of unsteady gait and slurred speech.  CONSULTATIONS:   neurology  PERTINENT RADIOLOGIC STUDIES: Ct Head Wo Contrast  03/08/2015   CLINICAL DATA:  Altered mental status.  Stroke 6 weeks ago.  EXAM: CT HEAD WITHOUT CONTRAST  TECHNIQUE: Contiguous axial images were obtained from the base of the skull through the vertex without intravenous contrast.  COMPARISON:  Brain MR dated 03/21/2012 and head CT dated 10/08/2011.  FINDINGS: Diffusely enlarged ventricles and subarachnoid spaces. Patchy white matter low density in both cerebral hemispheres. Old right cerebellar hemisphere lacunar infarct without significant change. No intracranial hemorrhage, mass lesion  or CT evidence of acute infarction. Unremarkable bones. Inferior bilateral maxillary sinus mucosal thickening and retention cyst formation.  IMPRESSION: 1. No acute abnormality. 2. Mildly progressive atrophy and chronic small vessel white matter ischemic changes and stable old right cerebellar hemisphere lacunar infarct. 3. Mild chronic bilateral maxillary sinusitis. These results were called by telephone at the time of interpretation on 03/08/2015 at 7:16 pm to Dr. Armida Sans, who verbally acknowledged these results.   Electronically Signed   By: Claudie Revering M.D.   On: 03/08/2015 19:16   Mr Virgel Paling Wo Contrast  03/08/2015   CLINICAL DATA:  81 year old male code stroke with confusion, impaired gait, nausea and vomiting. Ischemic stroke 4 weeks ago treated with thrombolytics. Initial encounter.  EXAM: MRI HEAD WITHOUT CONTRAST  MRA HEAD WITHOUT CONTRAST  TECHNIQUE: Multiplanar, multiecho pulse sequences of the brain and surrounding structures were obtained without intravenous contrast. Angiographic images of the head were obtained using MRA technique without contrast.  COMPARISON:  Head CT without contrast 1911 hours today. Brain MRI 03/21/2012  FINDINGS: MRI HEAD FINDINGS  Major intracranial vascular flow voids are stable. No restricted diffusion to suggest acute infarction. No midline shift, mass effect, evidence of mass lesion, ventriculomegaly, extra-axial collection or acute intracranial hemorrhage. Cervicomedullary junction and pituitary are within normal limits. Cerebral volume has not significantly changed since 2013. Scattered and patchy cerebral white matter T2 and FLAIR hyperintensity also appears stable. A small chronic infarct in the inferior right cerebellum is unchanged.  Normal bone marrow signal. Negative visualized cervical spine. Visualized orbit soft tissues are within normal limits. Stable paranasal sinuses and mastoids. Visualized scalp soft tissues are within normal limits.  MRA HEAD FINDINGS   Antegrade flow in the posterior circulation with codominant distal vertebral arteries. Patent right PICA origin. Normal vertebrobasilar junction. Dominant appearing right AICA. No basilar artery stenosis. SCA and PCA origins are within normal limits. Posterior communicating arteries are diminutive or absent.  Left PCA branches are within normal limits. Moderate stenosis at the origin of the right P3 superior division with preserved distal flow.  Antegrade flow in both ICA siphons. Moderate stenosis of the right ICA cavernous segment. Both carotid termini are patent.  Dominant appearing left ACA A1 segment. Anterior communicating artery and visualized bilateral ACA branches are within normal limits.  Both MCA origins are patent. There is mild stenosis in the left M1 segment with preserved distal flow and patent left MCA bifurcation. No major left MCA branch occlusion identified. Mild irregularity of the right M1 segment with patent right MCA bifurcation and no major right MCA branch occlusion identified.  IMPRESSION: 1.  No acute intracranial abnormality. 2. Stable appearance of chronic ischemic disease since 2013. 3. Intracranial atherosclerosis with moderate cavernous right ICA and right PCA P3 segment stenoses. No major circle of Willis branch occlusion.   Electronically Signed   By: Genevie Ann M.D.   On: 03/08/2015 21:16   Mr Brain Wo Contrast  03/08/2015   CLINICAL DATA:  81 year old male code stroke with confusion, impaired gait, nausea and vomiting. Ischemic stroke 4 weeks ago treated with thrombolytics. Initial encounter.  EXAM: MRI HEAD WITHOUT CONTRAST  MRA HEAD WITHOUT CONTRAST  TECHNIQUE: Multiplanar, multiecho pulse sequences of the brain and surrounding structures were obtained without intravenous contrast. Angiographic images of the head were obtained using MRA technique without contrast.  COMPARISON:  Head CT without contrast 1911 hours today. Brain MRI 03/21/2012  FINDINGS: MRI HEAD FINDINGS  Major  intracranial vascular flow voids are stable. No restricted diffusion to suggest acute infarction. No midline shift, mass effect, evidence of mass lesion, ventriculomegaly, extra-axial collection or acute intracranial hemorrhage. Cervicomedullary junction and pituitary are within normal limits. Cerebral volume has not significantly changed since 2013. Scattered and patchy cerebral white matter T2 and FLAIR hyperintensity also appears stable. A small chronic infarct in the inferior right cerebellum is unchanged.  Normal bone marrow signal. Negative visualized cervical spine. Visualized orbit soft tissues are within normal limits. Stable paranasal sinuses and mastoids. Visualized scalp soft tissues are within normal limits.  MRA HEAD FINDINGS  Antegrade flow in the posterior circulation with codominant distal vertebral arteries. Patent right PICA origin. Normal vertebrobasilar junction. Dominant appearing right AICA. No basilar artery stenosis. SCA and PCA origins are within normal limits. Posterior communicating arteries are diminutive or absent.  Left PCA branches are within normal limits. Moderate stenosis at the origin of the right P3 superior division with preserved distal flow.  Antegrade flow in both ICA siphons. Moderate stenosis of the right ICA cavernous segment. Both carotid termini are patent.  Dominant appearing left ACA A1 segment. Anterior communicating artery and visualized bilateral ACA branches are within normal limits.  Both MCA origins are patent. There is mild stenosis in the left M1 segment with preserved distal flow and patent left MCA bifurcation. No major left MCA branch occlusion identified. Mild irregularity of the right M1 segment with patent right MCA bifurcation and no major right MCA branch occlusion identified.  IMPRESSION: 1.  No acute intracranial abnormality. 2. Stable appearance of chronic ischemic disease since 2013. 3. Intracranial atherosclerosis with moderate cavernous right ICA  and right PCA P3 segment stenoses. No major circle of Willis branch occlusion.   Electronically Signed   By: Genevie Ann M.D.   On: 03/08/2015 21:16     PERTINENT LAB RESULTS: CBC:  Recent Labs  03/08/15 1855 03/08/15 1908  WBC 5.0  --   HGB 12.0* 12.2*  HCT 36.7* 36.0*  PLT 146*  --    CMET CMP     Component Value Date/Time   NA 142 03/10/2015 0813   K 4.3 03/10/2015 0813   CL 107 03/10/2015 0813   CO2 28 03/10/2015 0813   GLUCOSE 174* 03/10/2015 0813   BUN 17 03/10/2015 0813   CREATININE 1.31 03/10/2015 0813   CALCIUM 8.9 03/10/2015 0813   PROT 6.0 03/08/2015 1855   ALBUMIN 2.4* 03/08/2015 1855   AST 22 03/08/2015 1855   ALT 18 03/08/2015 1855   ALKPHOS 50 03/08/2015 1855   BILITOT 0.5 03/08/2015 1855   GFRNONAA 50* 03/10/2015 0813   GFRAA 58* 03/10/2015 0813    GFR Estimated Creatinine Clearance: 55 mL/min (by C-G formula based on Cr of 1.31). No results for input(s): LIPASE, AMYLASE in the last 72 hours. No results for input(s): CKTOTAL, CKMB, CKMBINDEX, TROPONINI in the last 72 hours. Invalid input(s): POCBNP No results for input(s): DDIMER in the last 72 hours. No results  for input(s): HGBA1C in the last 72 hours.  Recent Labs  03/09/15 0603  CHOL 191  HDL 41  LDLCALC 89  TRIG 306*  CHOLHDL 4.7   No results for input(s): TSH, T4TOTAL, T3FREE, THYROIDAB in the last 72 hours.  Invalid input(s): FREET3  Recent Labs  03/09/15 0603  VITAMINB12 618  FOLATE 17.3  FERRITIN 18*  TIBC 222  IRON 39*  RETICCTPCT 1.4   Coags:  Recent Labs  03/08/15 1855  INR 0.96   Microbiology: No results found for this or any previous visit (from the past 240 hour(s)).   BRIEF HOSPITAL COURSE:  Suspected posterior circulation TIA (transient ischemic attack): Admitted with headache, confusion, slurred speech and unsteady gait. Thankfully all of these symptoms have resolved. MRI of the brain negative for acute CVA. Has history of atrial fibrillation, approximately  2 years back Xarelto was discontinued because of persistent mouth bleeding-and not requiring hospitalization or PRBC transfusion. Discussed with neurologist/stroke team-Dr. Leonie Man, recommended to start Eliquis. Family and patient aware of risks. Initially CT angiogram of the head and neck was contemplated, however upon further reviewing her chart and follow-up by Dr. Leonie Man, it is not felt to be required any more  Active Problems:  Paroxysmal Atrial fibrillation: Continue rate control with atenolol and Cardizem.CHADS2VASC score of at least 6, see above regarding anticoagulation-started on Eliquis.   Mild acute renal failure: much better with IVF   Hypertension: Controlled, allowed some mild permissive hypertension on admission. Continue with atenolol, Cardizem, lisinopril and HCTZ on discharge. Further optimization deferred   Type 2 diabetes: CBGs controlled with SSI while inpatient. Continue metformin on discharge. Await A1c-still pending at the time of discharge   History of CAD: Continue with aspirin, atenolol and statin.   BPH: Continue Flomax   Hypothyroidism: Continue levothyroxine  TODAY-DAY OF DISCHARGE:  Subjective:   Otto Felkins today has no headache,no chest abdominal pain,no new weakness tingling or numbness, feels much better wants to go home today.   Objective:   Blood pressure 169/60, pulse 66, temperature 98.2 F (36.8 C), temperature source Oral, resp. rate 20, height 5\' 11"  (1.803 m), weight 99.791 kg (220 lb), SpO2 98 %.  Intake/Output Summary (Last 24 hours) at 03/10/15 1119 Last data filed at 03/09/15 1800  Gross per 24 hour  Intake    975 ml  Output    300 ml  Net    675 ml   Filed Weights   03/08/15 1922 03/08/15 2320  Weight: 97.977 kg (216 lb) 99.791 kg (220 lb)    Exam Awake Alert, Oriented *3, No new F.N deficits, Normal affect Mu[place].AT,PERRAL Supple Neck,No JVD, No cervical lymphadenopathy appriciated.  Symmetrical Chest wall movement, Good  air movement bilaterally, CTAB RRR,No Gallops,Rubs or new Murmurs, No Parasternal Heave +ve B.Sounds, Abd Soft, Non tender, No organomegaly appriciated, No rebound -guarding or rigidity. No Cyanosis, Clubbing or edema, No new Rash or bruise  DISCHARGE CONDITION: Stable  DISPOSITION: Home  DISCHARGE INSTRUCTIONS:    Activity:  As tolerated   Diet recommendation: Diabetic Diet Heart Healthy diet  Discharge Instructions    Ambulatory referral to Neurology    Complete by:  As directed   Please schedule post stroke follow up in 2 months.     Call MD for:  extreme fatigue    Complete by:  As directed      Call MD for:  persistant dizziness or light-headedness    Complete by:  As directed      Diet -  low sodium heart healthy    Complete by:  As directed      Increase activity slowly    Complete by:  As directed            Follow-up Information    Follow up with SETHI,PRAMOD, MD In 2 months.   Specialties:  Neurology, Radiology   Why:  Stroke Clinic, Office will call you with appointment date & time   Contact information:   [place] [address] [phone]       Total Time spent on discharge equals 45 minutes.  SignedOren Binet 03/10/2015 11:19 AM

## 2015-03-10 NOTE — Progress Notes (Signed)
Patient is discharged from room 4N02 at this time. Alert and in stable condition. IV site d/c'd as well as tele. Instructions read to patient and understanding verbalized. Left unit via wheelchair with all belongings and family at side.

## 2015-12-06 DIAGNOSIS — M109 Gout, unspecified: Secondary | ICD-10-CM | POA: Diagnosis not present

## 2015-12-29 DIAGNOSIS — E785 Hyperlipidemia, unspecified: Secondary | ICD-10-CM | POA: Diagnosis not present

## 2015-12-29 DIAGNOSIS — I2510 Atherosclerotic heart disease of native coronary artery without angina pectoris: Secondary | ICD-10-CM | POA: Diagnosis not present

## 2015-12-29 DIAGNOSIS — I480 Paroxysmal atrial fibrillation: Secondary | ICD-10-CM | POA: Diagnosis not present

## 2015-12-29 DIAGNOSIS — R609 Edema, unspecified: Secondary | ICD-10-CM | POA: Diagnosis not present

## 2015-12-29 DIAGNOSIS — I10 Essential (primary) hypertension: Secondary | ICD-10-CM | POA: Diagnosis not present

## 2016-01-08 DIAGNOSIS — N182 Chronic kidney disease, stage 2 (mild): Secondary | ICD-10-CM | POA: Diagnosis not present

## 2016-01-08 DIAGNOSIS — I10 Essential (primary) hypertension: Secondary | ICD-10-CM | POA: Diagnosis not present

## 2016-01-15 DIAGNOSIS — J069 Acute upper respiratory infection, unspecified: Secondary | ICD-10-CM | POA: Diagnosis not present

## 2016-01-15 DIAGNOSIS — I509 Heart failure, unspecified: Secondary | ICD-10-CM | POA: Diagnosis not present

## 2016-01-15 DIAGNOSIS — I2510 Atherosclerotic heart disease of native coronary artery without angina pectoris: Secondary | ICD-10-CM | POA: Diagnosis not present

## 2016-01-15 DIAGNOSIS — Z8673 Personal history of transient ischemic attack (TIA), and cerebral infarction without residual deficits: Secondary | ICD-10-CM | POA: Diagnosis not present

## 2016-01-15 DIAGNOSIS — I4891 Unspecified atrial fibrillation: Secondary | ICD-10-CM | POA: Diagnosis not present

## 2016-01-15 DIAGNOSIS — E039 Hypothyroidism, unspecified: Secondary | ICD-10-CM | POA: Diagnosis not present

## 2016-01-15 DIAGNOSIS — K219 Gastro-esophageal reflux disease without esophagitis: Secondary | ICD-10-CM | POA: Diagnosis not present

## 2016-01-15 DIAGNOSIS — I10 Essential (primary) hypertension: Secondary | ICD-10-CM | POA: Diagnosis not present

## 2016-01-15 DIAGNOSIS — Z7982 Long term (current) use of aspirin: Secondary | ICD-10-CM | POA: Diagnosis not present

## 2016-01-15 DIAGNOSIS — G92 Toxic encephalopathy: Secondary | ICD-10-CM | POA: Diagnosis not present

## 2016-01-15 DIAGNOSIS — E119 Type 2 diabetes mellitus without complications: Secondary | ICD-10-CM | POA: Diagnosis not present

## 2016-01-15 DIAGNOSIS — D649 Anemia, unspecified: Secondary | ICD-10-CM | POA: Diagnosis not present

## 2016-01-15 DIAGNOSIS — R7989 Other specified abnormal findings of blood chemistry: Secondary | ICD-10-CM | POA: Diagnosis not present

## 2016-01-15 DIAGNOSIS — J101 Influenza due to other identified influenza virus with other respiratory manifestations: Secondary | ICD-10-CM | POA: Diagnosis not present

## 2016-01-15 DIAGNOSIS — R079 Chest pain, unspecified: Secondary | ICD-10-CM | POA: Diagnosis not present

## 2016-01-15 DIAGNOSIS — R809 Proteinuria, unspecified: Secondary | ICD-10-CM | POA: Diagnosis not present

## 2016-01-15 DIAGNOSIS — E7800 Pure hypercholesterolemia, unspecified: Secondary | ICD-10-CM | POA: Diagnosis not present

## 2016-01-15 DIAGNOSIS — J180 Bronchopneumonia, unspecified organism: Secondary | ICD-10-CM | POA: Diagnosis not present

## 2016-01-15 DIAGNOSIS — M109 Gout, unspecified: Secondary | ICD-10-CM | POA: Diagnosis not present

## 2016-01-15 DIAGNOSIS — R0602 Shortness of breath: Secondary | ICD-10-CM | POA: Diagnosis not present

## 2016-01-15 DIAGNOSIS — Z79899 Other long term (current) drug therapy: Secondary | ICD-10-CM | POA: Diagnosis not present

## 2016-01-15 DIAGNOSIS — R609 Edema, unspecified: Secondary | ICD-10-CM | POA: Diagnosis not present

## 2016-01-15 DIAGNOSIS — Z23 Encounter for immunization: Secondary | ICD-10-CM | POA: Diagnosis not present

## 2016-01-15 DIAGNOSIS — G9341 Metabolic encephalopathy: Secondary | ICD-10-CM | POA: Diagnosis not present

## 2016-01-15 DIAGNOSIS — Z888 Allergy status to other drugs, medicaments and biological substances status: Secondary | ICD-10-CM | POA: Diagnosis not present

## 2016-01-15 DIAGNOSIS — J208 Acute bronchitis due to other specified organisms: Secondary | ICD-10-CM | POA: Diagnosis not present

## 2016-01-15 DIAGNOSIS — R0902 Hypoxemia: Secondary | ICD-10-CM | POA: Diagnosis not present

## 2016-01-17 DIAGNOSIS — Z23 Encounter for immunization: Secondary | ICD-10-CM | POA: Diagnosis not present

## 2016-01-17 DIAGNOSIS — I509 Heart failure, unspecified: Secondary | ICD-10-CM | POA: Diagnosis not present

## 2016-01-17 DIAGNOSIS — E7800 Pure hypercholesterolemia, unspecified: Secondary | ICD-10-CM | POA: Diagnosis not present

## 2016-01-17 DIAGNOSIS — G92 Toxic encephalopathy: Secondary | ICD-10-CM | POA: Diagnosis not present

## 2016-01-17 DIAGNOSIS — E119 Type 2 diabetes mellitus without complications: Secondary | ICD-10-CM | POA: Diagnosis not present

## 2016-01-17 DIAGNOSIS — Z8673 Personal history of transient ischemic attack (TIA), and cerebral infarction without residual deficits: Secondary | ICD-10-CM | POA: Diagnosis not present

## 2016-01-17 DIAGNOSIS — K219 Gastro-esophageal reflux disease without esophagitis: Secondary | ICD-10-CM | POA: Diagnosis not present

## 2016-01-17 DIAGNOSIS — E039 Hypothyroidism, unspecified: Secondary | ICD-10-CM | POA: Diagnosis not present

## 2016-01-17 DIAGNOSIS — I4891 Unspecified atrial fibrillation: Secondary | ICD-10-CM | POA: Diagnosis not present

## 2016-01-17 DIAGNOSIS — I2510 Atherosclerotic heart disease of native coronary artery without angina pectoris: Secondary | ICD-10-CM | POA: Diagnosis not present

## 2016-01-17 DIAGNOSIS — J180 Bronchopneumonia, unspecified organism: Secondary | ICD-10-CM | POA: Diagnosis not present

## 2016-01-17 DIAGNOSIS — R0902 Hypoxemia: Secondary | ICD-10-CM | POA: Diagnosis not present

## 2016-01-17 DIAGNOSIS — R0602 Shortness of breath: Secondary | ICD-10-CM | POA: Diagnosis not present

## 2016-01-17 DIAGNOSIS — Z79899 Other long term (current) drug therapy: Secondary | ICD-10-CM | POA: Diagnosis not present

## 2016-01-17 DIAGNOSIS — I10 Essential (primary) hypertension: Secondary | ICD-10-CM | POA: Diagnosis not present

## 2016-01-17 DIAGNOSIS — Z888 Allergy status to other drugs, medicaments and biological substances status: Secondary | ICD-10-CM | POA: Diagnosis not present

## 2016-01-17 DIAGNOSIS — M109 Gout, unspecified: Secondary | ICD-10-CM | POA: Diagnosis not present

## 2016-01-17 DIAGNOSIS — Z7982 Long term (current) use of aspirin: Secondary | ICD-10-CM | POA: Diagnosis not present

## 2016-01-26 DIAGNOSIS — E7800 Pure hypercholesterolemia, unspecified: Secondary | ICD-10-CM | POA: Diagnosis not present

## 2016-01-26 DIAGNOSIS — E119 Type 2 diabetes mellitus without complications: Secondary | ICD-10-CM | POA: Diagnosis not present

## 2016-01-26 DIAGNOSIS — E039 Hypothyroidism, unspecified: Secondary | ICD-10-CM | POA: Diagnosis not present

## 2016-01-26 DIAGNOSIS — D649 Anemia, unspecified: Secondary | ICD-10-CM | POA: Diagnosis not present

## 2016-01-26 DIAGNOSIS — I639 Cerebral infarction, unspecified: Secondary | ICD-10-CM | POA: Diagnosis not present

## 2016-01-26 DIAGNOSIS — I10 Essential (primary) hypertension: Secondary | ICD-10-CM | POA: Diagnosis not present

## 2016-01-26 DIAGNOSIS — M159 Polyosteoarthritis, unspecified: Secondary | ICD-10-CM | POA: Diagnosis not present

## 2016-01-26 DIAGNOSIS — R809 Proteinuria, unspecified: Secondary | ICD-10-CM | POA: Diagnosis not present

## 2016-01-26 DIAGNOSIS — K219 Gastro-esophageal reflux disease without esophagitis: Secondary | ICD-10-CM | POA: Diagnosis not present

## 2016-01-26 DIAGNOSIS — I4891 Unspecified atrial fibrillation: Secondary | ICD-10-CM | POA: Diagnosis not present

## 2016-01-26 DIAGNOSIS — R609 Edema, unspecified: Secondary | ICD-10-CM | POA: Diagnosis not present

## 2016-01-26 DIAGNOSIS — R05 Cough: Secondary | ICD-10-CM | POA: Diagnosis not present

## 2016-01-31 IMAGING — MR MR MRA HEAD W/O CM
9 of 14 series · 30 of 48 positions shown · non-contrast
Comparison: Head CT without contrast 2622 hours today. Brain MRI
03/21/2012

CLINICAL DATA: 81-year-old male code stroke with confusion,
impaired gait, nausea and vomiting. Ischemic stroke 4 weeks ago
treated with thrombolytics. Initial encounter.

EXAM:
MRI HEAD WITHOUT CONTRAST
MRA HEAD WITHOUT CONTRAST
TECHNIQUE: Multiplanar, multiecho pulse sequences of the brain and surrounding
structures were obtained without intravenous contrast. Angiographic
images of the head were obtained using MRA technique without
contrast.

[Series 6: T1 · sagittal · 5.0mm · 0.47mm/px · 1 of 23 slices shown]
[im 1/23]
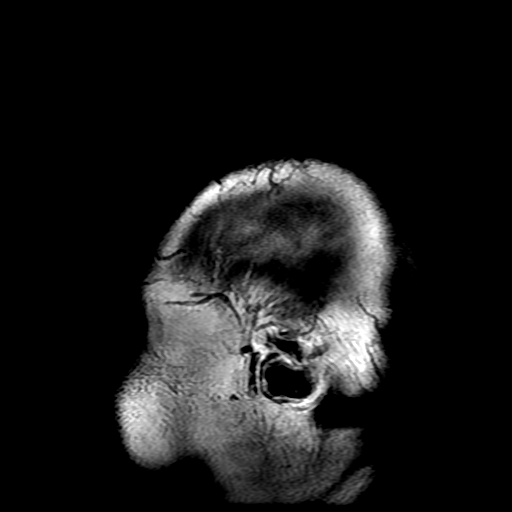

[Series 8: DWI · axial · 3.0mm · 1.09mm/px · z∈[-82,+67]mm · 7 of 102 slices shown (1 of 4)]
[im 1/102]
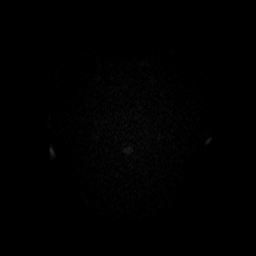
[im 17/102]
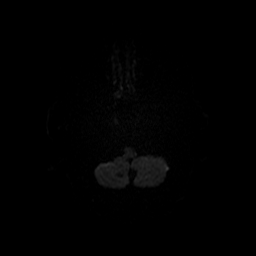
[im 34/102]
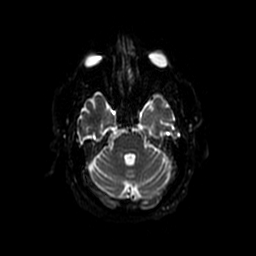
[im 51/102]
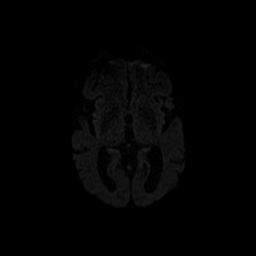
[im 68/102]
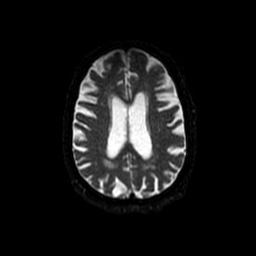
[im 85/102]
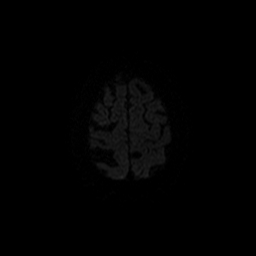
[im 102/102]
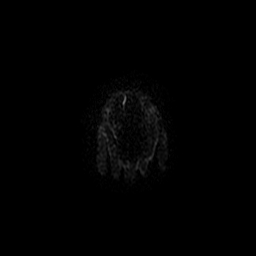

[Series 9: (id) mt fs · axial · 1.4mm · 0.43mm/px · z∈[-93,-41]mm · 5 of 136 slices shown]
[im 1/136]
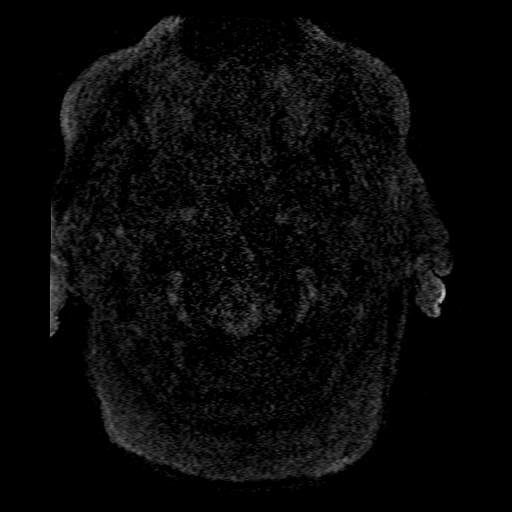
[im 16/136]
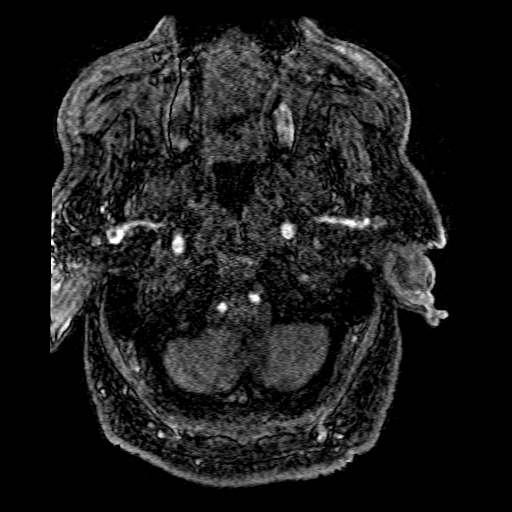
[im 46/136]
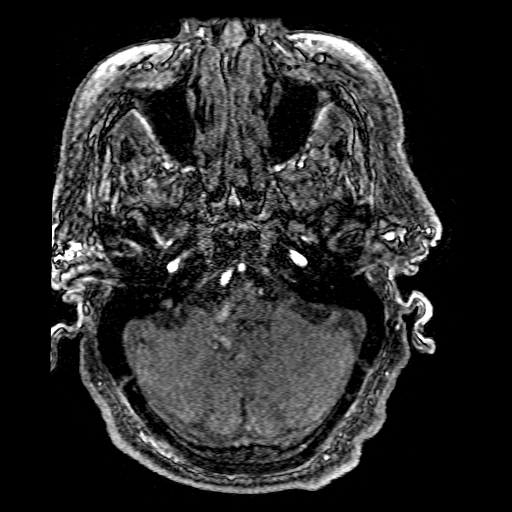
[im 61/136]
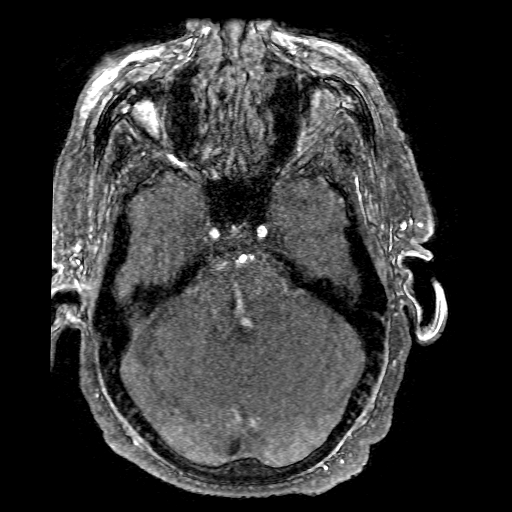
[im 76/136]
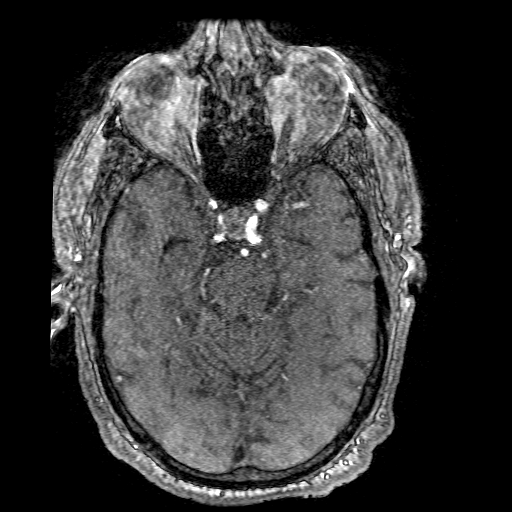

[Series 10: DWI · coronal · 5.0mm · 1.09mm/px · 5 of 64 slices shown (2 of 4)]
[im 1/64]
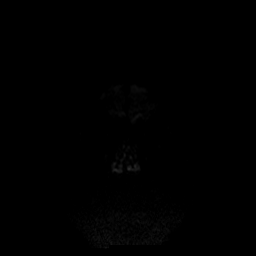
[im 16/64]
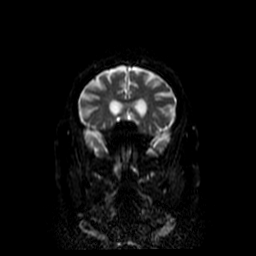
[im 32/64]
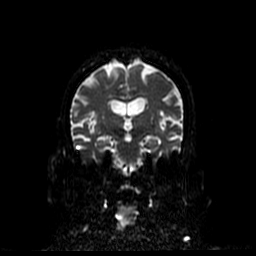
[im 48/64]
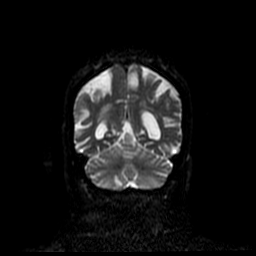
[im 64/64]
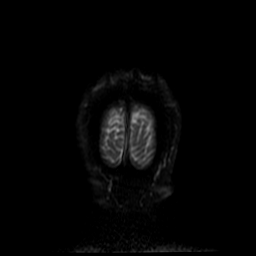

[Series 11: T2 · axial · 5.0mm · 0.43mm/px · z∈[-81,+68]mm · 2 of 26 slices shown (1 of 2)]
[im 1/26]
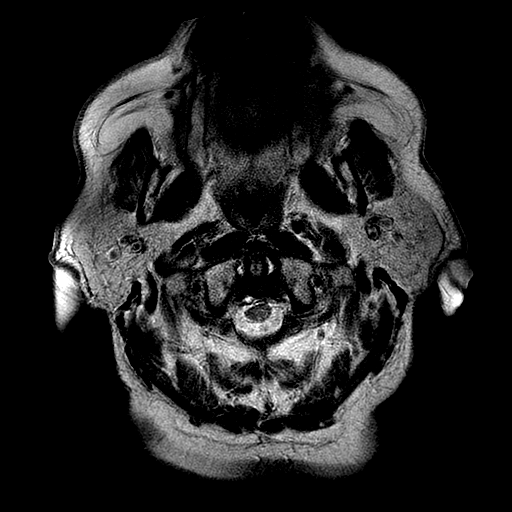
[im 26/26]
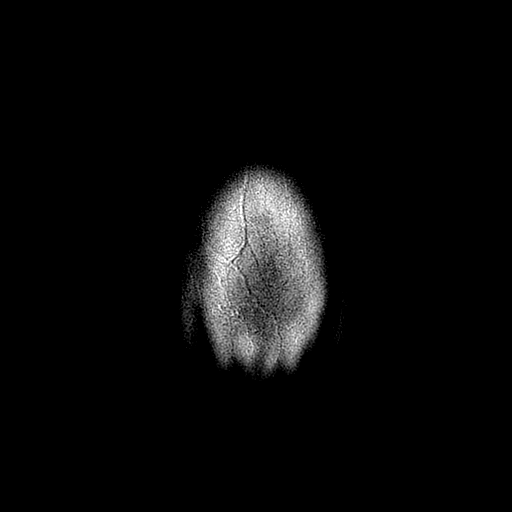

[Series 12: FLAIR · axial · 5.0mm · 0.43mm/px · z∈[-81,+68]mm · 2 of 26 slices shown]
[im 1/26]
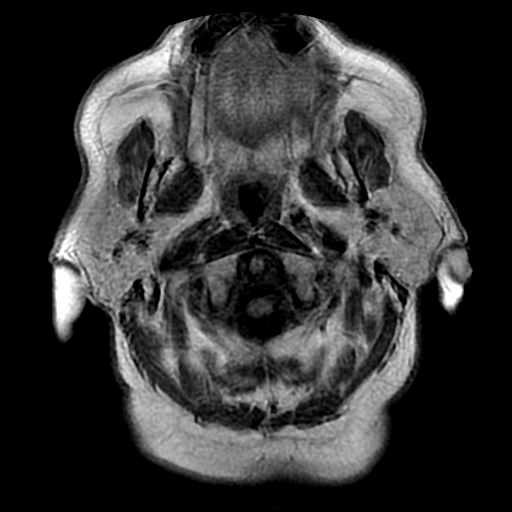
[im 26/26]
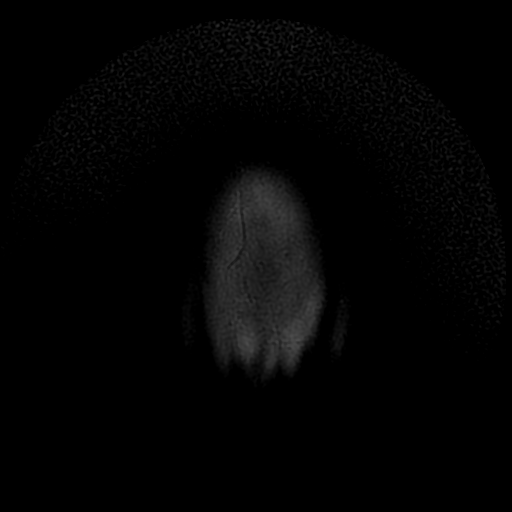

[Series 15: T2 · coronal · 5.0mm · 0.43mm/px · 2 of 27 slices shown (2 of 2)]
[im 1/27]
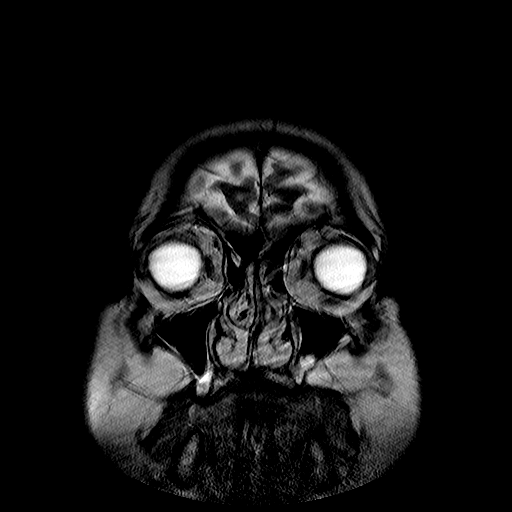
[im 27/27]
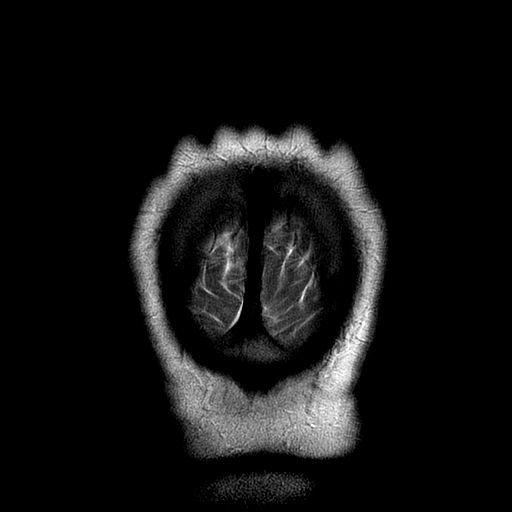

[Series 800: DWI · axial · 3.0mm · 1.09mm/px · z∈[-82,+67]mm · 4 of 51 slices shown (3 of 4)]
[im 1/51]
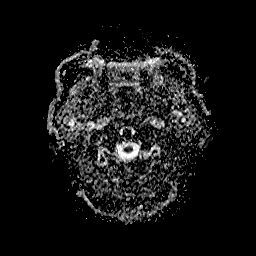
[im 17/51]
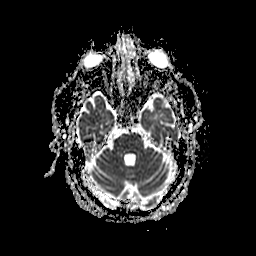
[im 34/51]
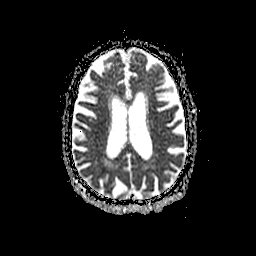
[im 51/51]
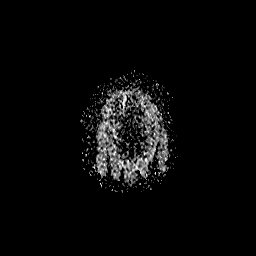

[Series 1000: DWI · coronal · 5.0mm · 1.09mm/px · 2 of 32 slices shown (4 of 4)]
[im 1/32]
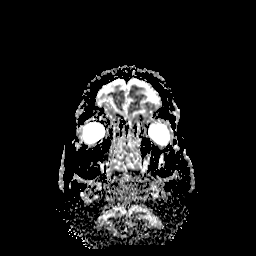
[im 32/32]
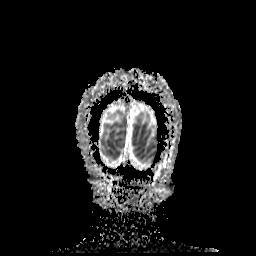

[30 of 48 positions shown; findings below may reference images not displayed]

FINDINGS: MRI HEAD FINDINGS

Major intracranial vascular flow voids are stable. No restricted
diffusion to suggest acute infarction. No midline shift, mass
effect, evidence of mass lesion, ventriculomegaly, extra-axial
collection or acute intracranial hemorrhage. Cervicomedullary
junction and pituitary are within normal limits. Cerebral volume has
not significantly changed since 1815. Scattered and patchy cerebral
white matter T2 and FLAIR hyperintensity also appears stable. A
small chronic infarct in the inferior right cerebellum is unchanged.

Normal bone marrow signal. Negative visualized cervical spine.
Visualized orbit soft tissues are within normal limits. Stable
paranasal sinuses and mastoids. Visualized scalp soft tissues are
within normal limits.

MRA HEAD FINDINGS

Antegrade flow in the posterior circulation with codominant distal
vertebral arteries. Patent right PICA origin. Normal vertebrobasilar
junction. Dominant appearing right AICA. No basilar artery stenosis.
SCA and PCA origins are within normal limits. Posterior
communicating arteries are diminutive or absent.

Left PCA branches are within normal limits. Moderate stenosis at the
origin of the right P3 superior division with preserved distal flow.

Antegrade flow in both ICA siphons. Moderate stenosis of the right
ICA cavernous segment. Both carotid termini are patent.

Dominant appearing left ACA A1 segment. Anterior communicating
artery and visualized bilateral ACA branches are within normal
limits.

Both MCA origins are patent. There is mild stenosis in the left M1
segment with preserved distal flow and patent left MCA bifurcation.
No major left MCA branch occlusion identified. Mild irregularity of
the right M1 segment with patent right MCA bifurcation and no major
right MCA branch occlusion identified.
IMPRESSION: 1.  No acute intracranial abnormality.
2. Stable appearance of chronic ischemic disease since [DATE]. Intracranial atherosclerosis with moderate cavernous right ICA
and right PCA P3 segment stenoses. No major circle of Willis branch
occlusion.

## 2016-02-09 DIAGNOSIS — M159 Polyosteoarthritis, unspecified: Secondary | ICD-10-CM | POA: Diagnosis not present

## 2016-02-09 DIAGNOSIS — I10 Essential (primary) hypertension: Secondary | ICD-10-CM | POA: Diagnosis not present

## 2016-02-09 DIAGNOSIS — I4891 Unspecified atrial fibrillation: Secondary | ICD-10-CM | POA: Diagnosis not present

## 2016-02-09 DIAGNOSIS — E039 Hypothyroidism, unspecified: Secondary | ICD-10-CM | POA: Diagnosis not present

## 2016-02-09 DIAGNOSIS — R609 Edema, unspecified: Secondary | ICD-10-CM | POA: Diagnosis not present

## 2016-02-09 DIAGNOSIS — D649 Anemia, unspecified: Secondary | ICD-10-CM | POA: Diagnosis not present

## 2016-02-09 DIAGNOSIS — E7800 Pure hypercholesterolemia, unspecified: Secondary | ICD-10-CM | POA: Diagnosis not present

## 2016-02-09 DIAGNOSIS — E119 Type 2 diabetes mellitus without complications: Secondary | ICD-10-CM | POA: Diagnosis not present

## 2016-02-09 DIAGNOSIS — K219 Gastro-esophageal reflux disease without esophagitis: Secondary | ICD-10-CM | POA: Diagnosis not present

## 2016-02-09 DIAGNOSIS — R809 Proteinuria, unspecified: Secondary | ICD-10-CM | POA: Diagnosis not present

## 2016-02-09 DIAGNOSIS — I639 Cerebral infarction, unspecified: Secondary | ICD-10-CM | POA: Diagnosis not present

## 2016-02-18 DIAGNOSIS — I10 Essential (primary) hypertension: Secondary | ICD-10-CM | POA: Diagnosis not present

## 2016-03-03 DIAGNOSIS — N401 Enlarged prostate with lower urinary tract symptoms: Secondary | ICD-10-CM | POA: Diagnosis not present

## 2016-03-14 DIAGNOSIS — M109 Gout, unspecified: Secondary | ICD-10-CM | POA: Diagnosis not present

## 2016-03-16 DIAGNOSIS — H35033 Hypertensive retinopathy, bilateral: Secondary | ICD-10-CM | POA: Diagnosis not present

## 2016-03-16 DIAGNOSIS — E119 Type 2 diabetes mellitus without complications: Secondary | ICD-10-CM | POA: Diagnosis not present

## 2016-03-16 DIAGNOSIS — H52223 Regular astigmatism, bilateral: Secondary | ICD-10-CM | POA: Diagnosis not present

## 2016-03-17 DIAGNOSIS — I10 Essential (primary) hypertension: Secondary | ICD-10-CM | POA: Diagnosis not present

## 2016-04-07 DIAGNOSIS — I10 Essential (primary) hypertension: Secondary | ICD-10-CM | POA: Diagnosis not present

## 2016-04-15 DIAGNOSIS — H1132 Conjunctival hemorrhage, left eye: Secondary | ICD-10-CM | POA: Diagnosis not present

## 2016-05-10 DIAGNOSIS — I10 Essential (primary) hypertension: Secondary | ICD-10-CM | POA: Diagnosis not present

## 2016-06-06 DIAGNOSIS — I1310 Hypertensive heart and chronic kidney disease without heart failure, with stage 1 through stage 4 chronic kidney disease, or unspecified chronic kidney disease: Secondary | ICD-10-CM | POA: Diagnosis not present

## 2016-06-06 DIAGNOSIS — Z7984 Long term (current) use of oral hypoglycemic drugs: Secondary | ICD-10-CM | POA: Diagnosis not present

## 2016-06-06 DIAGNOSIS — E1169 Type 2 diabetes mellitus with other specified complication: Secondary | ICD-10-CM | POA: Diagnosis not present

## 2016-06-06 DIAGNOSIS — Z87891 Personal history of nicotine dependence: Secondary | ICD-10-CM | POA: Diagnosis not present

## 2016-06-06 DIAGNOSIS — I10 Essential (primary) hypertension: Secondary | ICD-10-CM | POA: Diagnosis not present

## 2016-06-06 DIAGNOSIS — I480 Paroxysmal atrial fibrillation: Secondary | ICD-10-CM | POA: Diagnosis not present

## 2016-06-06 DIAGNOSIS — R0602 Shortness of breath: Secondary | ICD-10-CM | POA: Diagnosis not present

## 2016-06-06 DIAGNOSIS — D649 Anemia, unspecified: Secondary | ICD-10-CM | POA: Diagnosis not present

## 2016-06-06 DIAGNOSIS — I130 Hypertensive heart and chronic kidney disease with heart failure and stage 1 through stage 4 chronic kidney disease, or unspecified chronic kidney disease: Secondary | ICD-10-CM | POA: Diagnosis not present

## 2016-06-06 DIAGNOSIS — Z91013 Allergy to seafood: Secondary | ICD-10-CM | POA: Diagnosis not present

## 2016-06-06 DIAGNOSIS — E1122 Type 2 diabetes mellitus with diabetic chronic kidney disease: Secondary | ICD-10-CM | POA: Diagnosis not present

## 2016-06-06 DIAGNOSIS — I2510 Atherosclerotic heart disease of native coronary artery without angina pectoris: Secondary | ICD-10-CM | POA: Diagnosis not present

## 2016-06-06 DIAGNOSIS — Z8673 Personal history of transient ischemic attack (TIA), and cerebral infarction without residual deficits: Secondary | ICD-10-CM | POA: Diagnosis not present

## 2016-06-06 DIAGNOSIS — E039 Hypothyroidism, unspecified: Secondary | ICD-10-CM | POA: Diagnosis not present

## 2016-06-06 DIAGNOSIS — N179 Acute kidney failure, unspecified: Secondary | ICD-10-CM | POA: Diagnosis not present

## 2016-06-06 DIAGNOSIS — D638 Anemia in other chronic diseases classified elsewhere: Secondary | ICD-10-CM | POA: Diagnosis not present

## 2016-06-06 DIAGNOSIS — R0789 Other chest pain: Secondary | ICD-10-CM | POA: Diagnosis not present

## 2016-06-06 DIAGNOSIS — R079 Chest pain, unspecified: Secondary | ICD-10-CM | POA: Diagnosis not present

## 2016-06-06 DIAGNOSIS — R0902 Hypoxemia: Secondary | ICD-10-CM | POA: Diagnosis not present

## 2016-06-06 DIAGNOSIS — Z7982 Long term (current) use of aspirin: Secondary | ICD-10-CM | POA: Diagnosis not present

## 2016-06-06 DIAGNOSIS — J810 Acute pulmonary edema: Secondary | ICD-10-CM | POA: Diagnosis not present

## 2016-06-06 DIAGNOSIS — E6609 Other obesity due to excess calories: Secondary | ICD-10-CM | POA: Diagnosis not present

## 2016-06-06 DIAGNOSIS — I350 Nonrheumatic aortic (valve) stenosis: Secondary | ICD-10-CM | POA: Diagnosis not present

## 2016-06-06 DIAGNOSIS — N183 Chronic kidney disease, stage 3 (moderate): Secondary | ICD-10-CM | POA: Diagnosis not present

## 2016-06-06 DIAGNOSIS — Z955 Presence of coronary angioplasty implant and graft: Secondary | ICD-10-CM | POA: Diagnosis not present

## 2016-06-06 DIAGNOSIS — E119 Type 2 diabetes mellitus without complications: Secondary | ICD-10-CM | POA: Diagnosis not present

## 2016-06-06 DIAGNOSIS — I272 Other secondary pulmonary hypertension: Secondary | ICD-10-CM | POA: Diagnosis not present

## 2016-06-06 DIAGNOSIS — M109 Gout, unspecified: Secondary | ICD-10-CM | POA: Diagnosis not present

## 2016-06-06 DIAGNOSIS — Z9861 Coronary angioplasty status: Secondary | ICD-10-CM | POA: Diagnosis not present

## 2016-06-06 DIAGNOSIS — I25110 Atherosclerotic heart disease of native coronary artery with unstable angina pectoris: Secondary | ICD-10-CM | POA: Diagnosis not present

## 2016-06-06 DIAGNOSIS — E1165 Type 2 diabetes mellitus with hyperglycemia: Secondary | ICD-10-CM | POA: Diagnosis not present

## 2016-06-06 DIAGNOSIS — R0609 Other forms of dyspnea: Secondary | ICD-10-CM | POA: Diagnosis not present

## 2016-06-06 DIAGNOSIS — I5033 Acute on chronic diastolic (congestive) heart failure: Secondary | ICD-10-CM | POA: Diagnosis not present

## 2016-06-06 DIAGNOSIS — I252 Old myocardial infarction: Secondary | ICD-10-CM | POA: Diagnosis not present

## 2016-06-07 DIAGNOSIS — I10 Essential (primary) hypertension: Secondary | ICD-10-CM | POA: Diagnosis not present

## 2016-06-07 DIAGNOSIS — I491 Atrial premature depolarization: Secondary | ICD-10-CM | POA: Diagnosis not present

## 2016-06-07 DIAGNOSIS — I081 Rheumatic disorders of both mitral and tricuspid valves: Secondary | ICD-10-CM | POA: Diagnosis not present

## 2016-06-07 DIAGNOSIS — I517 Cardiomegaly: Secondary | ICD-10-CM | POA: Diagnosis not present

## 2016-06-07 DIAGNOSIS — J810 Acute pulmonary edema: Secondary | ICD-10-CM | POA: Diagnosis not present

## 2016-06-07 DIAGNOSIS — I350 Nonrheumatic aortic (valve) stenosis: Secondary | ICD-10-CM | POA: Diagnosis not present

## 2016-06-07 DIAGNOSIS — I2510 Atherosclerotic heart disease of native coronary artery without angina pectoris: Secondary | ICD-10-CM | POA: Diagnosis not present

## 2016-06-07 DIAGNOSIS — N183 Chronic kidney disease, stage 3 (moderate): Secondary | ICD-10-CM | POA: Diagnosis not present

## 2016-06-07 DIAGNOSIS — I480 Paroxysmal atrial fibrillation: Secondary | ICD-10-CM | POA: Diagnosis not present

## 2016-06-07 DIAGNOSIS — R079 Chest pain, unspecified: Secondary | ICD-10-CM | POA: Diagnosis not present

## 2016-06-07 DIAGNOSIS — I200 Unstable angina: Secondary | ICD-10-CM | POA: Diagnosis not present

## 2016-06-07 DIAGNOSIS — I509 Heart failure, unspecified: Secondary | ICD-10-CM | POA: Diagnosis not present

## 2016-06-07 DIAGNOSIS — E1169 Type 2 diabetes mellitus with other specified complication: Secondary | ICD-10-CM | POA: Diagnosis not present

## 2016-06-07 DIAGNOSIS — I272 Other secondary pulmonary hypertension: Secondary | ICD-10-CM | POA: Diagnosis not present

## 2016-06-07 DIAGNOSIS — E6609 Other obesity due to excess calories: Secondary | ICD-10-CM | POA: Diagnosis not present

## 2016-06-07 DIAGNOSIS — I25110 Atherosclerotic heart disease of native coronary artery with unstable angina pectoris: Secondary | ICD-10-CM | POA: Diagnosis not present

## 2016-06-08 DIAGNOSIS — E1122 Type 2 diabetes mellitus with diabetic chronic kidney disease: Secondary | ICD-10-CM | POA: Diagnosis not present

## 2016-06-08 DIAGNOSIS — N189 Chronic kidney disease, unspecified: Secondary | ICD-10-CM | POA: Diagnosis not present

## 2016-06-08 DIAGNOSIS — I10 Essential (primary) hypertension: Secondary | ICD-10-CM | POA: Diagnosis not present

## 2016-06-08 DIAGNOSIS — I2510 Atherosclerotic heart disease of native coronary artery without angina pectoris: Secondary | ICD-10-CM | POA: Diagnosis not present

## 2016-06-08 DIAGNOSIS — I209 Angina pectoris, unspecified: Secondary | ICD-10-CM | POA: Diagnosis not present

## 2016-06-08 DIAGNOSIS — I129 Hypertensive chronic kidney disease with stage 1 through stage 4 chronic kidney disease, or unspecified chronic kidney disease: Secondary | ICD-10-CM | POA: Diagnosis not present

## 2016-06-08 DIAGNOSIS — R079 Chest pain, unspecified: Secondary | ICD-10-CM | POA: Diagnosis not present

## 2016-06-08 DIAGNOSIS — I350 Nonrheumatic aortic (valve) stenosis: Secondary | ICD-10-CM | POA: Diagnosis not present

## 2016-06-09 DIAGNOSIS — I10 Essential (primary) hypertension: Secondary | ICD-10-CM | POA: Diagnosis not present

## 2016-06-09 DIAGNOSIS — E6609 Other obesity due to excess calories: Secondary | ICD-10-CM | POA: Diagnosis not present

## 2016-06-09 DIAGNOSIS — I509 Heart failure, unspecified: Secondary | ICD-10-CM | POA: Diagnosis not present

## 2016-06-09 DIAGNOSIS — I350 Nonrheumatic aortic (valve) stenosis: Secondary | ICD-10-CM | POA: Diagnosis not present

## 2016-06-09 DIAGNOSIS — J810 Acute pulmonary edema: Secondary | ICD-10-CM | POA: Diagnosis not present

## 2016-06-09 DIAGNOSIS — I2510 Atherosclerotic heart disease of native coronary artery without angina pectoris: Secondary | ICD-10-CM | POA: Diagnosis not present

## 2016-06-09 DIAGNOSIS — N183 Chronic kidney disease, stage 3 (moderate): Secondary | ICD-10-CM | POA: Diagnosis not present

## 2016-06-09 DIAGNOSIS — E8770 Fluid overload, unspecified: Secondary | ICD-10-CM | POA: Diagnosis not present

## 2016-06-09 DIAGNOSIS — I480 Paroxysmal atrial fibrillation: Secondary | ICD-10-CM | POA: Diagnosis not present

## 2016-06-09 DIAGNOSIS — N179 Acute kidney failure, unspecified: Secondary | ICD-10-CM | POA: Diagnosis not present

## 2016-06-09 DIAGNOSIS — R079 Chest pain, unspecified: Secondary | ICD-10-CM | POA: Diagnosis not present

## 2016-06-09 DIAGNOSIS — I25119 Atherosclerotic heart disease of native coronary artery with unspecified angina pectoris: Secondary | ICD-10-CM | POA: Diagnosis not present

## 2016-06-09 DIAGNOSIS — E1169 Type 2 diabetes mellitus with other specified complication: Secondary | ICD-10-CM | POA: Diagnosis not present

## 2016-06-10 DIAGNOSIS — I10 Essential (primary) hypertension: Secondary | ICD-10-CM | POA: Diagnosis not present

## 2016-06-10 DIAGNOSIS — I2510 Atherosclerotic heart disease of native coronary artery without angina pectoris: Secondary | ICD-10-CM | POA: Diagnosis not present

## 2016-06-10 DIAGNOSIS — Z955 Presence of coronary angioplasty implant and graft: Secondary | ICD-10-CM | POA: Diagnosis not present

## 2016-06-10 DIAGNOSIS — N179 Acute kidney failure, unspecified: Secondary | ICD-10-CM | POA: Diagnosis not present

## 2016-06-10 DIAGNOSIS — N183 Chronic kidney disease, stage 3 (moderate): Secondary | ICD-10-CM | POA: Diagnosis not present

## 2016-06-10 DIAGNOSIS — N189 Chronic kidney disease, unspecified: Secondary | ICD-10-CM | POA: Diagnosis not present

## 2016-06-10 DIAGNOSIS — E8770 Fluid overload, unspecified: Secondary | ICD-10-CM | POA: Diagnosis not present

## 2016-06-10 DIAGNOSIS — E119 Type 2 diabetes mellitus without complications: Secondary | ICD-10-CM | POA: Diagnosis not present

## 2016-06-10 DIAGNOSIS — I25119 Atherosclerotic heart disease of native coronary artery with unspecified angina pectoris: Secondary | ICD-10-CM | POA: Diagnosis not present

## 2016-06-10 DIAGNOSIS — J810 Acute pulmonary edema: Secondary | ICD-10-CM | POA: Diagnosis not present

## 2016-06-10 DIAGNOSIS — I480 Paroxysmal atrial fibrillation: Secondary | ICD-10-CM | POA: Diagnosis not present

## 2016-06-10 DIAGNOSIS — R079 Chest pain, unspecified: Secondary | ICD-10-CM | POA: Diagnosis not present

## 2016-06-10 DIAGNOSIS — E6609 Other obesity due to excess calories: Secondary | ICD-10-CM | POA: Diagnosis not present

## 2016-06-10 DIAGNOSIS — E1169 Type 2 diabetes mellitus with other specified complication: Secondary | ICD-10-CM | POA: Diagnosis not present

## 2016-06-10 DIAGNOSIS — I350 Nonrheumatic aortic (valve) stenosis: Secondary | ICD-10-CM | POA: Diagnosis not present

## 2016-06-11 DIAGNOSIS — I350 Nonrheumatic aortic (valve) stenosis: Secondary | ICD-10-CM | POA: Diagnosis not present

## 2016-06-11 DIAGNOSIS — N179 Acute kidney failure, unspecified: Secondary | ICD-10-CM | POA: Diagnosis not present

## 2016-06-11 DIAGNOSIS — N183 Chronic kidney disease, stage 3 (moderate): Secondary | ICD-10-CM | POA: Diagnosis not present

## 2016-06-11 DIAGNOSIS — E1169 Type 2 diabetes mellitus with other specified complication: Secondary | ICD-10-CM | POA: Diagnosis not present

## 2016-06-11 DIAGNOSIS — N189 Chronic kidney disease, unspecified: Secondary | ICD-10-CM | POA: Diagnosis not present

## 2016-06-11 DIAGNOSIS — E8770 Fluid overload, unspecified: Secondary | ICD-10-CM | POA: Diagnosis not present

## 2016-06-11 DIAGNOSIS — I10 Essential (primary) hypertension: Secondary | ICD-10-CM | POA: Diagnosis not present

## 2016-06-11 DIAGNOSIS — E119 Type 2 diabetes mellitus without complications: Secondary | ICD-10-CM | POA: Diagnosis not present

## 2016-06-11 DIAGNOSIS — Z955 Presence of coronary angioplasty implant and graft: Secondary | ICD-10-CM | POA: Diagnosis not present

## 2016-06-11 DIAGNOSIS — I25119 Atherosclerotic heart disease of native coronary artery with unspecified angina pectoris: Secondary | ICD-10-CM | POA: Diagnosis not present

## 2016-06-12 DIAGNOSIS — I10 Essential (primary) hypertension: Secondary | ICD-10-CM | POA: Diagnosis not present

## 2016-06-12 DIAGNOSIS — E1169 Type 2 diabetes mellitus with other specified complication: Secondary | ICD-10-CM | POA: Diagnosis not present

## 2016-06-12 DIAGNOSIS — N189 Chronic kidney disease, unspecified: Secondary | ICD-10-CM | POA: Diagnosis not present

## 2016-06-12 DIAGNOSIS — I209 Angina pectoris, unspecified: Secondary | ICD-10-CM | POA: Diagnosis not present

## 2016-06-12 DIAGNOSIS — N179 Acute kidney failure, unspecified: Secondary | ICD-10-CM | POA: Diagnosis not present

## 2016-06-12 DIAGNOSIS — N183 Chronic kidney disease, stage 3 (moderate): Secondary | ICD-10-CM | POA: Diagnosis not present

## 2016-06-12 DIAGNOSIS — I350 Nonrheumatic aortic (valve) stenosis: Secondary | ICD-10-CM | POA: Diagnosis not present

## 2016-06-12 DIAGNOSIS — Z955 Presence of coronary angioplasty implant and graft: Secondary | ICD-10-CM | POA: Diagnosis not present

## 2016-06-12 DIAGNOSIS — D631 Anemia in chronic kidney disease: Secondary | ICD-10-CM | POA: Diagnosis not present

## 2016-06-13 DIAGNOSIS — I209 Angina pectoris, unspecified: Secondary | ICD-10-CM | POA: Diagnosis not present

## 2016-06-13 DIAGNOSIS — N179 Acute kidney failure, unspecified: Secondary | ICD-10-CM | POA: Diagnosis not present

## 2016-06-13 DIAGNOSIS — E1169 Type 2 diabetes mellitus with other specified complication: Secondary | ICD-10-CM | POA: Diagnosis not present

## 2016-06-13 DIAGNOSIS — I350 Nonrheumatic aortic (valve) stenosis: Secondary | ICD-10-CM | POA: Diagnosis not present

## 2016-06-13 DIAGNOSIS — D631 Anemia in chronic kidney disease: Secondary | ICD-10-CM | POA: Diagnosis not present

## 2016-06-13 DIAGNOSIS — I10 Essential (primary) hypertension: Secondary | ICD-10-CM | POA: Diagnosis not present

## 2016-06-13 DIAGNOSIS — N189 Chronic kidney disease, unspecified: Secondary | ICD-10-CM | POA: Diagnosis not present

## 2016-06-13 DIAGNOSIS — N183 Chronic kidney disease, stage 3 (moderate): Secondary | ICD-10-CM | POA: Diagnosis not present

## 2016-06-14 DIAGNOSIS — I129 Hypertensive chronic kidney disease with stage 1 through stage 4 chronic kidney disease, or unspecified chronic kidney disease: Secondary | ICD-10-CM | POA: Diagnosis not present

## 2016-06-14 DIAGNOSIS — J810 Acute pulmonary edema: Secondary | ICD-10-CM | POA: Diagnosis not present

## 2016-06-14 DIAGNOSIS — I480 Paroxysmal atrial fibrillation: Secondary | ICD-10-CM | POA: Diagnosis not present

## 2016-06-14 DIAGNOSIS — E119 Type 2 diabetes mellitus without complications: Secondary | ICD-10-CM | POA: Diagnosis not present

## 2016-06-14 DIAGNOSIS — D631 Anemia in chronic kidney disease: Secondary | ICD-10-CM | POA: Diagnosis not present

## 2016-06-14 DIAGNOSIS — I350 Nonrheumatic aortic (valve) stenosis: Secondary | ICD-10-CM | POA: Diagnosis not present

## 2016-06-14 DIAGNOSIS — I209 Angina pectoris, unspecified: Secondary | ICD-10-CM | POA: Diagnosis not present

## 2016-06-14 DIAGNOSIS — E8770 Fluid overload, unspecified: Secondary | ICD-10-CM | POA: Diagnosis not present

## 2016-06-14 DIAGNOSIS — D649 Anemia, unspecified: Secondary | ICD-10-CM | POA: Diagnosis not present

## 2016-06-14 DIAGNOSIS — N189 Chronic kidney disease, unspecified: Secondary | ICD-10-CM | POA: Diagnosis not present

## 2016-06-14 DIAGNOSIS — N183 Chronic kidney disease, stage 3 (moderate): Secondary | ICD-10-CM | POA: Diagnosis not present

## 2016-06-14 DIAGNOSIS — N179 Acute kidney failure, unspecified: Secondary | ICD-10-CM | POA: Diagnosis not present

## 2016-06-16 DIAGNOSIS — E039 Hypothyroidism, unspecified: Secondary | ICD-10-CM | POA: Diagnosis not present

## 2016-06-16 DIAGNOSIS — I10 Essential (primary) hypertension: Secondary | ICD-10-CM | POA: Diagnosis not present

## 2016-06-16 DIAGNOSIS — R809 Proteinuria, unspecified: Secondary | ICD-10-CM | POA: Diagnosis not present

## 2016-06-16 DIAGNOSIS — D649 Anemia, unspecified: Secondary | ICD-10-CM | POA: Diagnosis not present

## 2016-06-16 DIAGNOSIS — I4891 Unspecified atrial fibrillation: Secondary | ICD-10-CM | POA: Diagnosis not present

## 2016-06-16 DIAGNOSIS — E291 Testicular hypofunction: Secondary | ICD-10-CM | POA: Diagnosis not present

## 2016-06-16 DIAGNOSIS — E7800 Pure hypercholesterolemia, unspecified: Secondary | ICD-10-CM | POA: Diagnosis not present

## 2016-06-16 DIAGNOSIS — R609 Edema, unspecified: Secondary | ICD-10-CM | POA: Diagnosis not present

## 2016-06-16 DIAGNOSIS — E119 Type 2 diabetes mellitus without complications: Secondary | ICD-10-CM | POA: Diagnosis not present

## 2016-06-16 DIAGNOSIS — I639 Cerebral infarction, unspecified: Secondary | ICD-10-CM | POA: Diagnosis not present

## 2016-06-16 DIAGNOSIS — M159 Polyosteoarthritis, unspecified: Secondary | ICD-10-CM | POA: Diagnosis not present

## 2016-06-16 DIAGNOSIS — K219 Gastro-esophageal reflux disease without esophagitis: Secondary | ICD-10-CM | POA: Diagnosis not present

## 2016-06-22 DIAGNOSIS — E7800 Pure hypercholesterolemia, unspecified: Secondary | ICD-10-CM | POA: Diagnosis not present

## 2016-06-22 DIAGNOSIS — D649 Anemia, unspecified: Secondary | ICD-10-CM | POA: Diagnosis not present

## 2016-06-22 DIAGNOSIS — E119 Type 2 diabetes mellitus without complications: Secondary | ICD-10-CM | POA: Diagnosis not present

## 2016-06-22 DIAGNOSIS — I639 Cerebral infarction, unspecified: Secondary | ICD-10-CM | POA: Diagnosis not present

## 2016-06-22 DIAGNOSIS — Z1389 Encounter for screening for other disorder: Secondary | ICD-10-CM | POA: Diagnosis not present

## 2016-06-22 DIAGNOSIS — R609 Edema, unspecified: Secondary | ICD-10-CM | POA: Diagnosis not present

## 2016-06-22 DIAGNOSIS — N182 Chronic kidney disease, stage 2 (mild): Secondary | ICD-10-CM | POA: Diagnosis not present

## 2016-06-22 DIAGNOSIS — R809 Proteinuria, unspecified: Secondary | ICD-10-CM | POA: Diagnosis not present

## 2016-06-22 DIAGNOSIS — M159 Polyosteoarthritis, unspecified: Secondary | ICD-10-CM | POA: Diagnosis not present

## 2016-06-22 DIAGNOSIS — I10 Essential (primary) hypertension: Secondary | ICD-10-CM | POA: Diagnosis not present

## 2016-06-22 DIAGNOSIS — K219 Gastro-esophageal reflux disease without esophagitis: Secondary | ICD-10-CM | POA: Diagnosis not present

## 2016-06-22 DIAGNOSIS — I4891 Unspecified atrial fibrillation: Secondary | ICD-10-CM | POA: Diagnosis not present

## 2016-06-22 DIAGNOSIS — N189 Chronic kidney disease, unspecified: Secondary | ICD-10-CM | POA: Diagnosis not present

## 2016-06-22 DIAGNOSIS — E039 Hypothyroidism, unspecified: Secondary | ICD-10-CM | POA: Diagnosis not present

## 2016-06-22 DIAGNOSIS — Z9181 History of falling: Secondary | ICD-10-CM | POA: Diagnosis not present

## 2016-06-30 DIAGNOSIS — I480 Paroxysmal atrial fibrillation: Secondary | ICD-10-CM | POA: Diagnosis not present

## 2016-06-30 DIAGNOSIS — I209 Angina pectoris, unspecified: Secondary | ICD-10-CM | POA: Diagnosis not present

## 2016-06-30 DIAGNOSIS — E1169 Type 2 diabetes mellitus with other specified complication: Secondary | ICD-10-CM | POA: Diagnosis not present

## 2016-06-30 DIAGNOSIS — N183 Chronic kidney disease, stage 3 (moderate): Secondary | ICD-10-CM | POA: Diagnosis not present

## 2016-06-30 DIAGNOSIS — I350 Nonrheumatic aortic (valve) stenosis: Secondary | ICD-10-CM | POA: Diagnosis not present

## 2016-06-30 DIAGNOSIS — I10 Essential (primary) hypertension: Secondary | ICD-10-CM | POA: Diagnosis not present

## 2016-07-14 DIAGNOSIS — R809 Proteinuria, unspecified: Secondary | ICD-10-CM | POA: Diagnosis not present

## 2016-07-14 DIAGNOSIS — I4891 Unspecified atrial fibrillation: Secondary | ICD-10-CM | POA: Diagnosis not present

## 2016-07-14 DIAGNOSIS — K219 Gastro-esophageal reflux disease without esophagitis: Secondary | ICD-10-CM | POA: Diagnosis not present

## 2016-07-14 DIAGNOSIS — D649 Anemia, unspecified: Secondary | ICD-10-CM | POA: Diagnosis not present

## 2016-07-14 DIAGNOSIS — E039 Hypothyroidism, unspecified: Secondary | ICD-10-CM | POA: Diagnosis not present

## 2016-07-14 DIAGNOSIS — Z1389 Encounter for screening for other disorder: Secondary | ICD-10-CM | POA: Diagnosis not present

## 2016-07-14 DIAGNOSIS — I639 Cerebral infarction, unspecified: Secondary | ICD-10-CM | POA: Diagnosis not present

## 2016-07-14 DIAGNOSIS — Z9181 History of falling: Secondary | ICD-10-CM | POA: Diagnosis not present

## 2016-07-14 DIAGNOSIS — M109 Gout, unspecified: Secondary | ICD-10-CM | POA: Diagnosis not present

## 2016-07-14 DIAGNOSIS — E7800 Pure hypercholesterolemia, unspecified: Secondary | ICD-10-CM | POA: Diagnosis not present

## 2016-07-14 DIAGNOSIS — M159 Polyosteoarthritis, unspecified: Secondary | ICD-10-CM | POA: Diagnosis not present

## 2016-07-14 DIAGNOSIS — R609 Edema, unspecified: Secondary | ICD-10-CM | POA: Diagnosis not present

## 2016-07-17 DIAGNOSIS — E039 Hypothyroidism, unspecified: Secondary | ICD-10-CM | POA: Diagnosis not present

## 2016-07-17 DIAGNOSIS — R0602 Shortness of breath: Secondary | ICD-10-CM | POA: Diagnosis not present

## 2016-07-17 DIAGNOSIS — J9601 Acute respiratory failure with hypoxia: Secondary | ICD-10-CM | POA: Diagnosis not present

## 2016-07-17 DIAGNOSIS — Z87891 Personal history of nicotine dependence: Secondary | ICD-10-CM | POA: Diagnosis not present

## 2016-07-17 DIAGNOSIS — E8809 Other disorders of plasma-protein metabolism, not elsewhere classified: Secondary | ICD-10-CM | POA: Diagnosis not present

## 2016-07-17 DIAGNOSIS — R0781 Pleurodynia: Secondary | ICD-10-CM | POA: Diagnosis not present

## 2016-07-17 DIAGNOSIS — I2510 Atherosclerotic heart disease of native coronary artery without angina pectoris: Secondary | ICD-10-CM | POA: Diagnosis not present

## 2016-07-17 DIAGNOSIS — W19XXXA Unspecified fall, initial encounter: Secondary | ICD-10-CM | POA: Diagnosis not present

## 2016-07-17 DIAGNOSIS — J810 Acute pulmonary edema: Secondary | ICD-10-CM | POA: Diagnosis not present

## 2016-07-17 DIAGNOSIS — I25110 Atherosclerotic heart disease of native coronary artery with unstable angina pectoris: Secondary | ICD-10-CM | POA: Diagnosis not present

## 2016-07-17 DIAGNOSIS — R0600 Dyspnea, unspecified: Secondary | ICD-10-CM | POA: Diagnosis not present

## 2016-07-17 DIAGNOSIS — Z8673 Personal history of transient ischemic attack (TIA), and cerebral infarction without residual deficits: Secondary | ICD-10-CM | POA: Diagnosis not present

## 2016-07-17 DIAGNOSIS — I252 Old myocardial infarction: Secondary | ICD-10-CM | POA: Diagnosis not present

## 2016-07-17 DIAGNOSIS — I517 Cardiomegaly: Secondary | ICD-10-CM | POA: Diagnosis not present

## 2016-07-17 DIAGNOSIS — E1122 Type 2 diabetes mellitus with diabetic chronic kidney disease: Secondary | ICD-10-CM | POA: Diagnosis not present

## 2016-07-17 DIAGNOSIS — I509 Heart failure, unspecified: Secondary | ICD-10-CM | POA: Diagnosis not present

## 2016-07-17 DIAGNOSIS — D649 Anemia, unspecified: Secondary | ICD-10-CM | POA: Diagnosis not present

## 2016-07-17 DIAGNOSIS — Z955 Presence of coronary angioplasty implant and graft: Secondary | ICD-10-CM | POA: Diagnosis not present

## 2016-07-17 DIAGNOSIS — K449 Diaphragmatic hernia without obstruction or gangrene: Secondary | ICD-10-CM | POA: Diagnosis not present

## 2016-07-17 DIAGNOSIS — I130 Hypertensive heart and chronic kidney disease with heart failure and stage 1 through stage 4 chronic kidney disease, or unspecified chronic kidney disease: Secondary | ICD-10-CM | POA: Diagnosis not present

## 2016-07-17 DIAGNOSIS — R079 Chest pain, unspecified: Secondary | ICD-10-CM | POA: Diagnosis not present

## 2016-07-17 DIAGNOSIS — N179 Acute kidney failure, unspecified: Secondary | ICD-10-CM | POA: Diagnosis not present

## 2016-07-17 DIAGNOSIS — N182 Chronic kidney disease, stage 2 (mild): Secondary | ICD-10-CM | POA: Diagnosis not present

## 2016-07-17 DIAGNOSIS — F419 Anxiety disorder, unspecified: Secondary | ICD-10-CM | POA: Diagnosis not present

## 2016-07-17 DIAGNOSIS — I208 Other forms of angina pectoris: Secondary | ICD-10-CM | POA: Diagnosis not present

## 2016-07-17 DIAGNOSIS — K5900 Constipation, unspecified: Secondary | ICD-10-CM | POA: Diagnosis not present

## 2016-07-17 DIAGNOSIS — R0609 Other forms of dyspnea: Secondary | ICD-10-CM | POA: Diagnosis not present

## 2016-07-17 DIAGNOSIS — I119 Hypertensive heart disease without heart failure: Secondary | ICD-10-CM | POA: Diagnosis not present

## 2016-07-17 DIAGNOSIS — I4891 Unspecified atrial fibrillation: Secondary | ICD-10-CM | POA: Diagnosis not present

## 2016-07-17 DIAGNOSIS — R4182 Altered mental status, unspecified: Secondary | ICD-10-CM | POA: Diagnosis not present

## 2016-07-17 DIAGNOSIS — I350 Nonrheumatic aortic (valve) stenosis: Secondary | ICD-10-CM | POA: Diagnosis not present

## 2016-07-17 DIAGNOSIS — G319 Degenerative disease of nervous system, unspecified: Secondary | ICD-10-CM | POA: Diagnosis not present

## 2016-07-17 DIAGNOSIS — F17211 Nicotine dependence, cigarettes, in remission: Secondary | ICD-10-CM | POA: Diagnosis not present

## 2016-07-18 DIAGNOSIS — I1310 Hypertensive heart and chronic kidney disease without heart failure, with stage 1 through stage 4 chronic kidney disease, or unspecified chronic kidney disease: Secondary | ICD-10-CM | POA: Diagnosis not present

## 2016-07-18 DIAGNOSIS — F419 Anxiety disorder, unspecified: Secondary | ICD-10-CM | POA: Diagnosis not present

## 2016-07-18 DIAGNOSIS — I209 Angina pectoris, unspecified: Secondary | ICD-10-CM | POA: Diagnosis not present

## 2016-07-18 DIAGNOSIS — I10 Essential (primary) hypertension: Secondary | ICD-10-CM | POA: Diagnosis not present

## 2016-07-18 DIAGNOSIS — I25118 Atherosclerotic heart disease of native coronary artery with other forms of angina pectoris: Secondary | ICD-10-CM | POA: Diagnosis not present

## 2016-07-18 DIAGNOSIS — N182 Chronic kidney disease, stage 2 (mild): Secondary | ICD-10-CM | POA: Diagnosis not present

## 2016-07-18 DIAGNOSIS — I350 Nonrheumatic aortic (valve) stenosis: Secondary | ICD-10-CM | POA: Diagnosis not present

## 2016-07-18 DIAGNOSIS — I25110 Atherosclerotic heart disease of native coronary artery with unstable angina pectoris: Secondary | ICD-10-CM | POA: Diagnosis not present

## 2016-07-18 DIAGNOSIS — N189 Chronic kidney disease, unspecified: Secondary | ICD-10-CM | POA: Diagnosis not present

## 2016-07-18 DIAGNOSIS — R0609 Other forms of dyspnea: Secondary | ICD-10-CM | POA: Diagnosis not present

## 2016-07-18 DIAGNOSIS — E1169 Type 2 diabetes mellitus with other specified complication: Secondary | ICD-10-CM | POA: Diagnosis not present

## 2016-07-18 DIAGNOSIS — K5900 Constipation, unspecified: Secondary | ICD-10-CM | POA: Diagnosis not present

## 2016-07-18 DIAGNOSIS — J9601 Acute respiratory failure with hypoxia: Secondary | ICD-10-CM | POA: Diagnosis not present

## 2016-07-18 DIAGNOSIS — J810 Acute pulmonary edema: Secondary | ICD-10-CM | POA: Diagnosis not present

## 2016-07-18 DIAGNOSIS — Z955 Presence of coronary angioplasty implant and graft: Secondary | ICD-10-CM | POA: Diagnosis not present

## 2016-07-19 DIAGNOSIS — K5900 Constipation, unspecified: Secondary | ICD-10-CM | POA: Diagnosis not present

## 2016-07-19 DIAGNOSIS — J9601 Acute respiratory failure with hypoxia: Secondary | ICD-10-CM | POA: Diagnosis not present

## 2016-07-19 DIAGNOSIS — E1169 Type 2 diabetes mellitus with other specified complication: Secondary | ICD-10-CM | POA: Diagnosis not present

## 2016-07-19 DIAGNOSIS — I350 Nonrheumatic aortic (valve) stenosis: Secondary | ICD-10-CM | POA: Diagnosis not present

## 2016-07-19 DIAGNOSIS — S0990XA Unspecified injury of head, initial encounter: Secondary | ICD-10-CM | POA: Diagnosis not present

## 2016-07-19 DIAGNOSIS — R0781 Pleurodynia: Secondary | ICD-10-CM | POA: Diagnosis not present

## 2016-07-19 DIAGNOSIS — N182 Chronic kidney disease, stage 2 (mild): Secondary | ICD-10-CM | POA: Diagnosis not present

## 2016-07-19 DIAGNOSIS — I2510 Atherosclerotic heart disease of native coronary artery without angina pectoris: Secondary | ICD-10-CM | POA: Diagnosis not present

## 2016-07-19 DIAGNOSIS — I10 Essential (primary) hypertension: Secondary | ICD-10-CM | POA: Diagnosis not present

## 2016-07-19 DIAGNOSIS — I209 Angina pectoris, unspecified: Secondary | ICD-10-CM | POA: Diagnosis not present

## 2016-07-19 DIAGNOSIS — N189 Chronic kidney disease, unspecified: Secondary | ICD-10-CM | POA: Diagnosis not present

## 2016-07-19 DIAGNOSIS — S299XXA Unspecified injury of thorax, initial encounter: Secondary | ICD-10-CM | POA: Diagnosis not present

## 2016-07-19 DIAGNOSIS — F419 Anxiety disorder, unspecified: Secondary | ICD-10-CM | POA: Diagnosis not present

## 2016-07-19 DIAGNOSIS — I25110 Atherosclerotic heart disease of native coronary artery with unstable angina pectoris: Secondary | ICD-10-CM | POA: Diagnosis not present

## 2016-07-19 DIAGNOSIS — I25118 Atherosclerotic heart disease of native coronary artery with other forms of angina pectoris: Secondary | ICD-10-CM | POA: Diagnosis not present

## 2016-07-19 DIAGNOSIS — J810 Acute pulmonary edema: Secondary | ICD-10-CM | POA: Diagnosis not present

## 2016-07-20 DIAGNOSIS — I209 Angina pectoris, unspecified: Secondary | ICD-10-CM | POA: Diagnosis not present

## 2016-07-20 DIAGNOSIS — N189 Chronic kidney disease, unspecified: Secondary | ICD-10-CM | POA: Diagnosis not present

## 2016-07-20 DIAGNOSIS — I25110 Atherosclerotic heart disease of native coronary artery with unstable angina pectoris: Secondary | ICD-10-CM | POA: Diagnosis not present

## 2016-07-20 DIAGNOSIS — E1169 Type 2 diabetes mellitus with other specified complication: Secondary | ICD-10-CM | POA: Diagnosis not present

## 2016-07-20 DIAGNOSIS — J9601 Acute respiratory failure with hypoxia: Secondary | ICD-10-CM | POA: Diagnosis not present

## 2016-07-20 DIAGNOSIS — K5900 Constipation, unspecified: Secondary | ICD-10-CM | POA: Diagnosis not present

## 2016-07-20 DIAGNOSIS — R0781 Pleurodynia: Secondary | ICD-10-CM | POA: Diagnosis not present

## 2016-07-20 DIAGNOSIS — I10 Essential (primary) hypertension: Secondary | ICD-10-CM | POA: Diagnosis not present

## 2016-07-20 DIAGNOSIS — J810 Acute pulmonary edema: Secondary | ICD-10-CM | POA: Diagnosis not present

## 2016-07-20 DIAGNOSIS — Z955 Presence of coronary angioplasty implant and graft: Secondary | ICD-10-CM | POA: Diagnosis not present

## 2016-07-20 DIAGNOSIS — N182 Chronic kidney disease, stage 2 (mild): Secondary | ICD-10-CM | POA: Diagnosis not present

## 2016-07-20 DIAGNOSIS — F419 Anxiety disorder, unspecified: Secondary | ICD-10-CM | POA: Diagnosis not present

## 2016-07-20 DIAGNOSIS — J811 Chronic pulmonary edema: Secondary | ICD-10-CM | POA: Diagnosis not present

## 2016-07-20 DIAGNOSIS — S0990XA Unspecified injury of head, initial encounter: Secondary | ICD-10-CM | POA: Diagnosis not present

## 2016-07-20 DIAGNOSIS — S299XXA Unspecified injury of thorax, initial encounter: Secondary | ICD-10-CM | POA: Diagnosis not present

## 2016-07-20 DIAGNOSIS — I2510 Atherosclerotic heart disease of native coronary artery without angina pectoris: Secondary | ICD-10-CM | POA: Diagnosis not present

## 2016-07-20 DIAGNOSIS — I509 Heart failure, unspecified: Secondary | ICD-10-CM | POA: Diagnosis not present

## 2016-07-20 DIAGNOSIS — I350 Nonrheumatic aortic (valve) stenosis: Secondary | ICD-10-CM | POA: Diagnosis not present

## 2016-07-28 DIAGNOSIS — E039 Hypothyroidism, unspecified: Secondary | ICD-10-CM | POA: Diagnosis not present

## 2016-07-28 DIAGNOSIS — E291 Testicular hypofunction: Secondary | ICD-10-CM | POA: Diagnosis not present

## 2016-07-28 DIAGNOSIS — K219 Gastro-esophageal reflux disease without esophagitis: Secondary | ICD-10-CM | POA: Diagnosis not present

## 2016-07-28 DIAGNOSIS — E7800 Pure hypercholesterolemia, unspecified: Secondary | ICD-10-CM | POA: Diagnosis not present

## 2016-07-28 DIAGNOSIS — D649 Anemia, unspecified: Secondary | ICD-10-CM | POA: Diagnosis not present

## 2016-07-28 DIAGNOSIS — R609 Edema, unspecified: Secondary | ICD-10-CM | POA: Diagnosis not present

## 2016-07-28 DIAGNOSIS — M109 Gout, unspecified: Secondary | ICD-10-CM | POA: Diagnosis not present

## 2016-07-28 DIAGNOSIS — I4891 Unspecified atrial fibrillation: Secondary | ICD-10-CM | POA: Diagnosis not present

## 2016-07-28 DIAGNOSIS — M159 Polyosteoarthritis, unspecified: Secondary | ICD-10-CM | POA: Diagnosis not present

## 2016-07-28 DIAGNOSIS — R809 Proteinuria, unspecified: Secondary | ICD-10-CM | POA: Diagnosis not present

## 2016-07-28 DIAGNOSIS — I10 Essential (primary) hypertension: Secondary | ICD-10-CM | POA: Diagnosis not present

## 2016-07-28 DIAGNOSIS — I639 Cerebral infarction, unspecified: Secondary | ICD-10-CM | POA: Diagnosis not present

## 2016-08-05 DIAGNOSIS — E782 Mixed hyperlipidemia: Secondary | ICD-10-CM | POA: Diagnosis not present

## 2016-08-05 DIAGNOSIS — I10 Essential (primary) hypertension: Secondary | ICD-10-CM | POA: Diagnosis not present

## 2016-08-05 DIAGNOSIS — N183 Chronic kidney disease, stage 3 (moderate): Secondary | ICD-10-CM | POA: Diagnosis not present

## 2016-08-05 DIAGNOSIS — I350 Nonrheumatic aortic (valve) stenosis: Secondary | ICD-10-CM | POA: Diagnosis not present

## 2016-08-05 DIAGNOSIS — I25118 Atherosclerotic heart disease of native coronary artery with other forms of angina pectoris: Secondary | ICD-10-CM | POA: Diagnosis not present

## 2016-08-05 DIAGNOSIS — I480 Paroxysmal atrial fibrillation: Secondary | ICD-10-CM | POA: Diagnosis not present

## 2016-08-15 DIAGNOSIS — I2510 Atherosclerotic heart disease of native coronary artery without angina pectoris: Secondary | ICD-10-CM | POA: Diagnosis not present

## 2016-08-15 DIAGNOSIS — I509 Heart failure, unspecified: Secondary | ICD-10-CM | POA: Diagnosis not present

## 2016-08-15 DIAGNOSIS — I480 Paroxysmal atrial fibrillation: Secondary | ICD-10-CM | POA: Diagnosis not present

## 2016-08-15 DIAGNOSIS — J810 Acute pulmonary edema: Secondary | ICD-10-CM | POA: Diagnosis not present

## 2016-08-15 DIAGNOSIS — I130 Hypertensive heart and chronic kidney disease with heart failure and stage 1 through stage 4 chronic kidney disease, or unspecified chronic kidney disease: Secondary | ICD-10-CM | POA: Diagnosis not present

## 2016-08-15 DIAGNOSIS — E1122 Type 2 diabetes mellitus with diabetic chronic kidney disease: Secondary | ICD-10-CM | POA: Diagnosis not present

## 2016-08-15 DIAGNOSIS — R0602 Shortness of breath: Secondary | ICD-10-CM | POA: Diagnosis not present

## 2016-08-15 DIAGNOSIS — Z955 Presence of coronary angioplasty implant and graft: Secondary | ICD-10-CM | POA: Diagnosis not present

## 2016-08-15 DIAGNOSIS — E039 Hypothyroidism, unspecified: Secondary | ICD-10-CM | POA: Diagnosis not present

## 2016-08-15 DIAGNOSIS — Z79899 Other long term (current) drug therapy: Secondary | ICD-10-CM | POA: Diagnosis not present

## 2016-08-15 DIAGNOSIS — N182 Chronic kidney disease, stage 2 (mild): Secondary | ICD-10-CM | POA: Diagnosis not present

## 2016-08-15 DIAGNOSIS — E785 Hyperlipidemia, unspecified: Secondary | ICD-10-CM | POA: Diagnosis not present

## 2016-08-15 DIAGNOSIS — Z87891 Personal history of nicotine dependence: Secondary | ICD-10-CM | POA: Diagnosis not present

## 2016-08-15 DIAGNOSIS — I10 Essential (primary) hypertension: Secondary | ICD-10-CM | POA: Diagnosis not present

## 2016-08-15 DIAGNOSIS — I252 Old myocardial infarction: Secondary | ICD-10-CM | POA: Diagnosis not present

## 2016-08-15 DIAGNOSIS — Z7984 Long term (current) use of oral hypoglycemic drugs: Secondary | ICD-10-CM | POA: Diagnosis not present

## 2016-08-15 DIAGNOSIS — E782 Mixed hyperlipidemia: Secondary | ICD-10-CM | POA: Diagnosis not present

## 2016-08-15 DIAGNOSIS — R079 Chest pain, unspecified: Secondary | ICD-10-CM | POA: Diagnosis not present

## 2016-08-15 DIAGNOSIS — D509 Iron deficiency anemia, unspecified: Secondary | ICD-10-CM | POA: Diagnosis not present

## 2016-08-15 DIAGNOSIS — Z8673 Personal history of transient ischemic attack (TIA), and cerebral infarction without residual deficits: Secondary | ICD-10-CM | POA: Diagnosis not present

## 2016-08-15 DIAGNOSIS — E119 Type 2 diabetes mellitus without complications: Secondary | ICD-10-CM | POA: Diagnosis not present

## 2016-08-15 DIAGNOSIS — Z7982 Long term (current) use of aspirin: Secondary | ICD-10-CM | POA: Diagnosis not present

## 2016-08-15 DIAGNOSIS — Z7902 Long term (current) use of antithrombotics/antiplatelets: Secondary | ICD-10-CM | POA: Diagnosis not present

## 2016-08-15 DIAGNOSIS — I110 Hypertensive heart disease with heart failure: Secondary | ICD-10-CM | POA: Diagnosis not present

## 2016-08-15 DIAGNOSIS — I350 Nonrheumatic aortic (valve) stenosis: Secondary | ICD-10-CM | POA: Diagnosis not present

## 2016-08-16 DIAGNOSIS — N183 Chronic kidney disease, stage 3 (moderate): Secondary | ICD-10-CM | POA: Diagnosis not present

## 2016-08-16 DIAGNOSIS — E118 Type 2 diabetes mellitus with unspecified complications: Secondary | ICD-10-CM | POA: Diagnosis not present

## 2016-08-16 DIAGNOSIS — I480 Paroxysmal atrial fibrillation: Secondary | ICD-10-CM | POA: Diagnosis not present

## 2016-08-16 DIAGNOSIS — J810 Acute pulmonary edema: Secondary | ICD-10-CM | POA: Diagnosis not present

## 2016-08-16 DIAGNOSIS — I509 Heart failure, unspecified: Secondary | ICD-10-CM | POA: Diagnosis not present

## 2016-08-16 DIAGNOSIS — I209 Angina pectoris, unspecified: Secondary | ICD-10-CM | POA: Diagnosis not present

## 2016-08-16 DIAGNOSIS — N182 Chronic kidney disease, stage 2 (mild): Secondary | ICD-10-CM | POA: Diagnosis not present

## 2016-08-16 DIAGNOSIS — I10 Essential (primary) hypertension: Secondary | ICD-10-CM | POA: Diagnosis not present

## 2016-08-16 DIAGNOSIS — E782 Mixed hyperlipidemia: Secondary | ICD-10-CM | POA: Diagnosis not present

## 2016-08-16 DIAGNOSIS — F17211 Nicotine dependence, cigarettes, in remission: Secondary | ICD-10-CM | POA: Diagnosis not present

## 2016-08-16 DIAGNOSIS — R0602 Shortness of breath: Secondary | ICD-10-CM | POA: Diagnosis not present

## 2016-08-16 DIAGNOSIS — D649 Anemia, unspecified: Secondary | ICD-10-CM | POA: Diagnosis not present

## 2016-08-16 DIAGNOSIS — E8779 Other fluid overload: Secondary | ICD-10-CM | POA: Diagnosis not present

## 2016-08-16 DIAGNOSIS — I350 Nonrheumatic aortic (valve) stenosis: Secondary | ICD-10-CM | POA: Diagnosis not present

## 2016-08-16 DIAGNOSIS — I2510 Atherosclerotic heart disease of native coronary artery without angina pectoris: Secondary | ICD-10-CM | POA: Diagnosis not present

## 2016-08-16 DIAGNOSIS — I130 Hypertensive heart and chronic kidney disease with heart failure and stage 1 through stage 4 chronic kidney disease, or unspecified chronic kidney disease: Secondary | ICD-10-CM | POA: Diagnosis not present

## 2016-08-16 DIAGNOSIS — E784 Other hyperlipidemia: Secondary | ICD-10-CM | POA: Diagnosis not present

## 2016-08-16 DIAGNOSIS — Z6831 Body mass index (BMI) 31.0-31.9, adult: Secondary | ICD-10-CM | POA: Diagnosis not present

## 2016-08-20 DIAGNOSIS — I25118 Atherosclerotic heart disease of native coronary artery with other forms of angina pectoris: Secondary | ICD-10-CM | POA: Diagnosis not present

## 2016-08-20 DIAGNOSIS — I480 Paroxysmal atrial fibrillation: Secondary | ICD-10-CM | POA: Diagnosis not present

## 2016-08-20 DIAGNOSIS — N183 Chronic kidney disease, stage 3 (moderate): Secondary | ICD-10-CM | POA: Diagnosis not present

## 2016-08-20 DIAGNOSIS — N182 Chronic kidney disease, stage 2 (mild): Secondary | ICD-10-CM | POA: Diagnosis not present

## 2016-08-20 DIAGNOSIS — E782 Mixed hyperlipidemia: Secondary | ICD-10-CM | POA: Diagnosis not present

## 2016-08-20 DIAGNOSIS — I10 Essential (primary) hypertension: Secondary | ICD-10-CM | POA: Diagnosis not present

## 2016-08-20 DIAGNOSIS — E1122 Type 2 diabetes mellitus with diabetic chronic kidney disease: Secondary | ICD-10-CM | POA: Diagnosis not present

## 2016-08-20 DIAGNOSIS — I350 Nonrheumatic aortic (valve) stenosis: Secondary | ICD-10-CM | POA: Diagnosis not present

## 2016-08-25 DIAGNOSIS — E119 Type 2 diabetes mellitus without complications: Secondary | ICD-10-CM | POA: Diagnosis not present

## 2016-08-25 DIAGNOSIS — I509 Heart failure, unspecified: Secondary | ICD-10-CM | POA: Diagnosis not present

## 2016-08-25 DIAGNOSIS — R809 Proteinuria, unspecified: Secondary | ICD-10-CM | POA: Diagnosis not present

## 2016-08-25 DIAGNOSIS — E039 Hypothyroidism, unspecified: Secondary | ICD-10-CM | POA: Diagnosis not present

## 2016-08-25 DIAGNOSIS — E349 Endocrine disorder, unspecified: Secondary | ICD-10-CM | POA: Diagnosis not present

## 2016-08-25 DIAGNOSIS — I4891 Unspecified atrial fibrillation: Secondary | ICD-10-CM | POA: Diagnosis not present

## 2016-08-25 DIAGNOSIS — K219 Gastro-esophageal reflux disease without esophagitis: Secondary | ICD-10-CM | POA: Diagnosis not present

## 2016-08-25 DIAGNOSIS — R609 Edema, unspecified: Secondary | ICD-10-CM | POA: Diagnosis not present

## 2016-08-25 DIAGNOSIS — Z23 Encounter for immunization: Secondary | ICD-10-CM | POA: Diagnosis not present

## 2016-08-25 DIAGNOSIS — D649 Anemia, unspecified: Secondary | ICD-10-CM | POA: Diagnosis not present

## 2016-08-25 DIAGNOSIS — E7800 Pure hypercholesterolemia, unspecified: Secondary | ICD-10-CM | POA: Diagnosis not present

## 2016-08-25 DIAGNOSIS — I639 Cerebral infarction, unspecified: Secondary | ICD-10-CM | POA: Diagnosis not present

## 2016-08-25 DIAGNOSIS — I10 Essential (primary) hypertension: Secondary | ICD-10-CM | POA: Diagnosis not present

## 2016-09-01 DIAGNOSIS — R0989 Other specified symptoms and signs involving the circulatory and respiratory systems: Secondary | ICD-10-CM | POA: Diagnosis not present

## 2016-09-01 DIAGNOSIS — I10 Essential (primary) hypertension: Secondary | ICD-10-CM | POA: Diagnosis not present

## 2016-09-01 DIAGNOSIS — Z01818 Encounter for other preprocedural examination: Secondary | ICD-10-CM | POA: Diagnosis not present

## 2016-09-01 DIAGNOSIS — I714 Abdominal aortic aneurysm, without rupture: Secondary | ICD-10-CM | POA: Diagnosis not present

## 2016-09-01 DIAGNOSIS — E1122 Type 2 diabetes mellitus with diabetic chronic kidney disease: Secondary | ICD-10-CM | POA: Diagnosis not present

## 2016-09-01 DIAGNOSIS — I25118 Atherosclerotic heart disease of native coronary artery with other forms of angina pectoris: Secondary | ICD-10-CM | POA: Diagnosis not present

## 2016-09-01 DIAGNOSIS — E039 Hypothyroidism, unspecified: Secondary | ICD-10-CM | POA: Diagnosis not present

## 2016-09-01 DIAGNOSIS — I2510 Atherosclerotic heart disease of native coronary artery without angina pectoris: Secondary | ICD-10-CM | POA: Diagnosis not present

## 2016-09-01 DIAGNOSIS — N183 Chronic kidney disease, stage 3 (moderate): Secondary | ICD-10-CM | POA: Diagnosis not present

## 2016-09-01 DIAGNOSIS — I350 Nonrheumatic aortic (valve) stenosis: Secondary | ICD-10-CM | POA: Diagnosis not present

## 2016-09-01 DIAGNOSIS — R918 Other nonspecific abnormal finding of lung field: Secondary | ICD-10-CM | POA: Diagnosis not present

## 2016-09-01 DIAGNOSIS — R0602 Shortness of breath: Secondary | ICD-10-CM | POA: Diagnosis not present

## 2016-09-01 DIAGNOSIS — E118 Type 2 diabetes mellitus with unspecified complications: Secondary | ICD-10-CM | POA: Diagnosis not present

## 2016-09-01 DIAGNOSIS — I209 Angina pectoris, unspecified: Secondary | ICD-10-CM | POA: Diagnosis not present

## 2016-09-01 DIAGNOSIS — E1169 Type 2 diabetes mellitus with other specified complication: Secondary | ICD-10-CM | POA: Diagnosis not present

## 2016-09-01 DIAGNOSIS — I517 Cardiomegaly: Secondary | ICD-10-CM | POA: Diagnosis not present

## 2016-09-14 DIAGNOSIS — Z794 Long term (current) use of insulin: Secondary | ICD-10-CM | POA: Diagnosis not present

## 2016-09-14 DIAGNOSIS — E1122 Type 2 diabetes mellitus with diabetic chronic kidney disease: Secondary | ICD-10-CM | POA: Diagnosis not present

## 2016-09-14 DIAGNOSIS — D649 Anemia, unspecified: Secondary | ICD-10-CM | POA: Diagnosis not present

## 2016-09-14 DIAGNOSIS — M109 Gout, unspecified: Secondary | ICD-10-CM | POA: Diagnosis not present

## 2016-09-14 DIAGNOSIS — E782 Mixed hyperlipidemia: Secondary | ICD-10-CM | POA: Diagnosis not present

## 2016-09-14 DIAGNOSIS — E039 Hypothyroidism, unspecified: Secondary | ICD-10-CM | POA: Diagnosis not present

## 2016-09-14 DIAGNOSIS — N179 Acute kidney failure, unspecified: Secondary | ICD-10-CM | POA: Diagnosis not present

## 2016-09-14 DIAGNOSIS — R809 Proteinuria, unspecified: Secondary | ICD-10-CM | POA: Diagnosis not present

## 2016-09-14 DIAGNOSIS — Z952 Presence of prosthetic heart valve: Secondary | ICD-10-CM | POA: Diagnosis not present

## 2016-09-14 DIAGNOSIS — I129 Hypertensive chronic kidney disease with stage 1 through stage 4 chronic kidney disease, or unspecified chronic kidney disease: Secondary | ICD-10-CM | POA: Diagnosis not present

## 2016-09-14 DIAGNOSIS — D62 Acute posthemorrhagic anemia: Secondary | ICD-10-CM | POA: Diagnosis not present

## 2016-09-14 DIAGNOSIS — I480 Paroxysmal atrial fibrillation: Secondary | ICD-10-CM | POA: Diagnosis not present

## 2016-09-14 DIAGNOSIS — Z955 Presence of coronary angioplasty implant and graft: Secondary | ICD-10-CM | POA: Diagnosis not present

## 2016-09-14 DIAGNOSIS — I25119 Atherosclerotic heart disease of native coronary artery with unspecified angina pectoris: Secondary | ICD-10-CM | POA: Diagnosis not present

## 2016-09-14 DIAGNOSIS — N529 Male erectile dysfunction, unspecified: Secondary | ICD-10-CM | POA: Diagnosis not present

## 2016-09-14 DIAGNOSIS — Z87891 Personal history of nicotine dependence: Secondary | ICD-10-CM | POA: Diagnosis not present

## 2016-09-14 DIAGNOSIS — Z01818 Encounter for other preprocedural examination: Secondary | ICD-10-CM | POA: Diagnosis not present

## 2016-09-14 DIAGNOSIS — J9811 Atelectasis: Secondary | ICD-10-CM | POA: Diagnosis not present

## 2016-09-14 DIAGNOSIS — Z7982 Long term (current) use of aspirin: Secondary | ICD-10-CM | POA: Diagnosis not present

## 2016-09-14 DIAGNOSIS — F419 Anxiety disorder, unspecified: Secondary | ICD-10-CM | POA: Diagnosis not present

## 2016-09-14 DIAGNOSIS — Z006 Encounter for examination for normal comparison and control in clinical research program: Secondary | ICD-10-CM | POA: Diagnosis not present

## 2016-09-14 DIAGNOSIS — I252 Old myocardial infarction: Secondary | ICD-10-CM | POA: Diagnosis not present

## 2016-09-14 DIAGNOSIS — N183 Chronic kidney disease, stage 3 (moderate): Secondary | ICD-10-CM | POA: Diagnosis not present

## 2016-09-14 DIAGNOSIS — I350 Nonrheumatic aortic (valve) stenosis: Secondary | ICD-10-CM | POA: Diagnosis not present

## 2016-09-14 DIAGNOSIS — I4581 Long QT syndrome: Secondary | ICD-10-CM | POA: Diagnosis not present

## 2016-09-14 DIAGNOSIS — Z8673 Personal history of transient ischemic attack (TIA), and cerebral infarction without residual deficits: Secondary | ICD-10-CM | POA: Diagnosis not present

## 2016-09-15 DIAGNOSIS — Z9889 Other specified postprocedural states: Secondary | ICD-10-CM | POA: Diagnosis not present

## 2016-09-15 DIAGNOSIS — I350 Nonrheumatic aortic (valve) stenosis: Secondary | ICD-10-CM | POA: Diagnosis not present

## 2016-09-15 DIAGNOSIS — Z01818 Encounter for other preprocedural examination: Secondary | ICD-10-CM | POA: Diagnosis not present

## 2016-09-15 DIAGNOSIS — R52 Pain, unspecified: Secondary | ICD-10-CM | POA: Diagnosis not present

## 2016-09-15 DIAGNOSIS — Z952 Presence of prosthetic heart valve: Secondary | ICD-10-CM | POA: Diagnosis not present

## 2016-09-16 DIAGNOSIS — I25118 Atherosclerotic heart disease of native coronary artery with other forms of angina pectoris: Secondary | ICD-10-CM | POA: Diagnosis not present

## 2016-09-16 DIAGNOSIS — J90 Pleural effusion, not elsewhere classified: Secondary | ICD-10-CM | POA: Diagnosis not present

## 2016-09-16 DIAGNOSIS — I350 Nonrheumatic aortic (valve) stenosis: Secondary | ICD-10-CM | POA: Diagnosis not present

## 2016-09-16 DIAGNOSIS — Z952 Presence of prosthetic heart valve: Secondary | ICD-10-CM | POA: Diagnosis not present

## 2016-09-17 DIAGNOSIS — Z952 Presence of prosthetic heart valve: Secondary | ICD-10-CM | POA: Diagnosis not present

## 2016-09-23 DIAGNOSIS — I350 Nonrheumatic aortic (valve) stenosis: Secondary | ICD-10-CM | POA: Diagnosis not present

## 2016-09-23 DIAGNOSIS — I25118 Atherosclerotic heart disease of native coronary artery with other forms of angina pectoris: Secondary | ICD-10-CM | POA: Diagnosis not present

## 2016-09-23 DIAGNOSIS — I480 Paroxysmal atrial fibrillation: Secondary | ICD-10-CM | POA: Diagnosis not present

## 2016-09-23 DIAGNOSIS — N183 Chronic kidney disease, stage 3 (moderate): Secondary | ICD-10-CM | POA: Diagnosis not present

## 2016-10-01 DIAGNOSIS — I129 Hypertensive chronic kidney disease with stage 1 through stage 4 chronic kidney disease, or unspecified chronic kidney disease: Secondary | ICD-10-CM | POA: Diagnosis not present

## 2016-10-01 DIAGNOSIS — E1129 Type 2 diabetes mellitus with other diabetic kidney complication: Secondary | ICD-10-CM | POA: Diagnosis not present

## 2016-10-01 DIAGNOSIS — M109 Gout, unspecified: Secondary | ICD-10-CM | POA: Diagnosis not present

## 2016-10-01 DIAGNOSIS — Z125 Encounter for screening for malignant neoplasm of prostate: Secondary | ICD-10-CM | POA: Diagnosis not present

## 2016-10-01 DIAGNOSIS — D509 Iron deficiency anemia, unspecified: Secondary | ICD-10-CM | POA: Diagnosis not present

## 2016-10-01 DIAGNOSIS — Z6832 Body mass index (BMI) 32.0-32.9, adult: Secondary | ICD-10-CM | POA: Diagnosis not present

## 2016-10-01 DIAGNOSIS — E039 Hypothyroidism, unspecified: Secondary | ICD-10-CM | POA: Diagnosis not present

## 2016-10-01 DIAGNOSIS — E785 Hyperlipidemia, unspecified: Secondary | ICD-10-CM | POA: Diagnosis not present

## 2016-10-02 DIAGNOSIS — Z7902 Long term (current) use of antithrombotics/antiplatelets: Secondary | ICD-10-CM | POA: Diagnosis not present

## 2016-10-02 DIAGNOSIS — I130 Hypertensive heart and chronic kidney disease with heart failure and stage 1 through stage 4 chronic kidney disease, or unspecified chronic kidney disease: Secondary | ICD-10-CM | POA: Diagnosis not present

## 2016-10-02 DIAGNOSIS — Z87891 Personal history of nicotine dependence: Secondary | ICD-10-CM | POA: Diagnosis not present

## 2016-10-02 DIAGNOSIS — Z8673 Personal history of transient ischemic attack (TIA), and cerebral infarction without residual deficits: Secondary | ICD-10-CM | POA: Diagnosis not present

## 2016-10-02 DIAGNOSIS — I480 Paroxysmal atrial fibrillation: Secondary | ICD-10-CM | POA: Diagnosis not present

## 2016-10-02 DIAGNOSIS — I2510 Atherosclerotic heart disease of native coronary artery without angina pectoris: Secondary | ICD-10-CM | POA: Diagnosis not present

## 2016-10-02 DIAGNOSIS — N183 Chronic kidney disease, stage 3 (moderate): Secondary | ICD-10-CM | POA: Diagnosis not present

## 2016-10-02 DIAGNOSIS — Z7982 Long term (current) use of aspirin: Secondary | ICD-10-CM | POA: Diagnosis not present

## 2016-10-02 DIAGNOSIS — D649 Anemia, unspecified: Secondary | ICD-10-CM | POA: Diagnosis not present

## 2016-10-02 DIAGNOSIS — I5032 Chronic diastolic (congestive) heart failure: Secondary | ICD-10-CM | POA: Diagnosis not present

## 2016-10-02 DIAGNOSIS — E039 Hypothyroidism, unspecified: Secondary | ICD-10-CM | POA: Diagnosis not present

## 2016-10-02 DIAGNOSIS — R710 Precipitous drop in hematocrit: Secondary | ICD-10-CM | POA: Diagnosis not present

## 2016-10-02 DIAGNOSIS — R531 Weakness: Secondary | ICD-10-CM | POA: Diagnosis not present

## 2016-10-02 DIAGNOSIS — D508 Other iron deficiency anemias: Secondary | ICD-10-CM | POA: Diagnosis not present

## 2016-10-02 DIAGNOSIS — Z952 Presence of prosthetic heart valve: Secondary | ICD-10-CM | POA: Diagnosis not present

## 2016-10-02 DIAGNOSIS — R42 Dizziness and giddiness: Secondary | ICD-10-CM | POA: Diagnosis not present

## 2016-10-02 DIAGNOSIS — Z79899 Other long term (current) drug therapy: Secondary | ICD-10-CM | POA: Diagnosis not present

## 2016-10-02 DIAGNOSIS — E1122 Type 2 diabetes mellitus with diabetic chronic kidney disease: Secondary | ICD-10-CM | POA: Diagnosis not present

## 2016-10-02 DIAGNOSIS — R32 Unspecified urinary incontinence: Secondary | ICD-10-CM | POA: Diagnosis not present

## 2016-10-02 DIAGNOSIS — I252 Old myocardial infarction: Secondary | ICD-10-CM | POA: Diagnosis not present

## 2016-10-02 DIAGNOSIS — Z955 Presence of coronary angioplasty implant and graft: Secondary | ICD-10-CM | POA: Diagnosis not present

## 2016-10-03 DIAGNOSIS — N183 Chronic kidney disease, stage 3 (moderate): Secondary | ICD-10-CM | POA: Diagnosis not present

## 2016-10-03 DIAGNOSIS — E1122 Type 2 diabetes mellitus with diabetic chronic kidney disease: Secondary | ICD-10-CM | POA: Diagnosis not present

## 2016-10-03 DIAGNOSIS — I480 Paroxysmal atrial fibrillation: Secondary | ICD-10-CM | POA: Diagnosis not present

## 2016-10-03 DIAGNOSIS — D649 Anemia, unspecified: Secondary | ICD-10-CM | POA: Diagnosis not present

## 2016-10-04 DIAGNOSIS — I10 Essential (primary) hypertension: Secondary | ICD-10-CM | POA: Diagnosis not present

## 2016-10-04 DIAGNOSIS — D509 Iron deficiency anemia, unspecified: Secondary | ICD-10-CM | POA: Diagnosis not present

## 2016-10-04 DIAGNOSIS — M109 Gout, unspecified: Secondary | ICD-10-CM | POA: Diagnosis not present

## 2016-10-04 DIAGNOSIS — N182 Chronic kidney disease, stage 2 (mild): Secondary | ICD-10-CM | POA: Diagnosis not present

## 2016-10-07 DIAGNOSIS — D509 Iron deficiency anemia, unspecified: Secondary | ICD-10-CM | POA: Diagnosis not present

## 2016-10-11 DIAGNOSIS — K219 Gastro-esophageal reflux disease without esophagitis: Secondary | ICD-10-CM | POA: Diagnosis not present

## 2016-10-11 DIAGNOSIS — K635 Polyp of colon: Secondary | ICD-10-CM | POA: Diagnosis not present

## 2016-10-11 DIAGNOSIS — D649 Anemia, unspecified: Secondary | ICD-10-CM | POA: Diagnosis not present

## 2016-10-18 DIAGNOSIS — D649 Anemia, unspecified: Secondary | ICD-10-CM | POA: Diagnosis not present

## 2016-10-22 DIAGNOSIS — E039 Hypothyroidism, unspecified: Secondary | ICD-10-CM | POA: Diagnosis not present

## 2016-10-22 DIAGNOSIS — I10 Essential (primary) hypertension: Secondary | ICD-10-CM | POA: Diagnosis not present

## 2016-10-22 DIAGNOSIS — Z9049 Acquired absence of other specified parts of digestive tract: Secondary | ICD-10-CM | POA: Diagnosis not present

## 2016-10-22 DIAGNOSIS — Z8673 Personal history of transient ischemic attack (TIA), and cerebral infarction without residual deficits: Secondary | ICD-10-CM | POA: Diagnosis not present

## 2016-10-22 DIAGNOSIS — K648 Other hemorrhoids: Secondary | ICD-10-CM | POA: Diagnosis not present

## 2016-10-22 DIAGNOSIS — D649 Anemia, unspecified: Secondary | ICD-10-CM | POA: Diagnosis not present

## 2016-10-22 DIAGNOSIS — K2980 Duodenitis without bleeding: Secondary | ICD-10-CM | POA: Diagnosis not present

## 2016-10-22 DIAGNOSIS — I252 Old myocardial infarction: Secondary | ICD-10-CM | POA: Diagnosis not present

## 2016-10-22 DIAGNOSIS — E119 Type 2 diabetes mellitus without complications: Secondary | ICD-10-CM | POA: Diagnosis not present

## 2016-10-22 DIAGNOSIS — Z955 Presence of coronary angioplasty implant and graft: Secondary | ICD-10-CM | POA: Diagnosis not present

## 2016-10-22 DIAGNOSIS — Z7901 Long term (current) use of anticoagulants: Secondary | ICD-10-CM | POA: Diagnosis not present

## 2016-10-22 DIAGNOSIS — Z79899 Other long term (current) drug therapy: Secondary | ICD-10-CM | POA: Diagnosis not present

## 2016-10-22 DIAGNOSIS — Z7984 Long term (current) use of oral hypoglycemic drugs: Secondary | ICD-10-CM | POA: Diagnosis not present

## 2016-10-22 DIAGNOSIS — N289 Disorder of kidney and ureter, unspecified: Secondary | ICD-10-CM | POA: Diagnosis not present

## 2016-10-22 DIAGNOSIS — D500 Iron deficiency anemia secondary to blood loss (chronic): Secondary | ICD-10-CM | POA: Diagnosis not present

## 2016-10-22 DIAGNOSIS — D122 Benign neoplasm of ascending colon: Secondary | ICD-10-CM | POA: Diagnosis not present

## 2016-10-22 DIAGNOSIS — K2970 Gastritis, unspecified, without bleeding: Secondary | ICD-10-CM | POA: Diagnosis not present

## 2016-10-25 DIAGNOSIS — E039 Hypothyroidism, unspecified: Secondary | ICD-10-CM | POA: Diagnosis not present

## 2016-10-25 DIAGNOSIS — D509 Iron deficiency anemia, unspecified: Secondary | ICD-10-CM | POA: Diagnosis not present

## 2016-10-25 DIAGNOSIS — Z6833 Body mass index (BMI) 33.0-33.9, adult: Secondary | ICD-10-CM | POA: Diagnosis not present

## 2016-10-25 DIAGNOSIS — K2980 Duodenitis without bleeding: Secondary | ICD-10-CM | POA: Diagnosis not present

## 2016-10-25 DIAGNOSIS — M25551 Pain in right hip: Secondary | ICD-10-CM | POA: Diagnosis not present

## 2016-10-26 DIAGNOSIS — D649 Anemia, unspecified: Secondary | ICD-10-CM | POA: Diagnosis not present

## 2016-10-29 DIAGNOSIS — Z6832 Body mass index (BMI) 32.0-32.9, adult: Secondary | ICD-10-CM | POA: Diagnosis not present

## 2016-10-29 DIAGNOSIS — M109 Gout, unspecified: Secondary | ICD-10-CM | POA: Diagnosis not present

## 2016-11-01 DIAGNOSIS — I10 Essential (primary) hypertension: Secondary | ICD-10-CM | POA: Diagnosis not present

## 2016-11-01 DIAGNOSIS — D509 Iron deficiency anemia, unspecified: Secondary | ICD-10-CM | POA: Diagnosis not present

## 2016-11-01 DIAGNOSIS — I350 Nonrheumatic aortic (valve) stenosis: Secondary | ICD-10-CM | POA: Diagnosis not present

## 2016-11-01 DIAGNOSIS — E1122 Type 2 diabetes mellitus with diabetic chronic kidney disease: Secondary | ICD-10-CM | POA: Diagnosis not present

## 2016-11-01 DIAGNOSIS — E782 Mixed hyperlipidemia: Secondary | ICD-10-CM | POA: Diagnosis not present

## 2016-11-01 DIAGNOSIS — I25118 Atherosclerotic heart disease of native coronary artery with other forms of angina pectoris: Secondary | ICD-10-CM | POA: Diagnosis not present

## 2016-11-01 DIAGNOSIS — I480 Paroxysmal atrial fibrillation: Secondary | ICD-10-CM | POA: Diagnosis not present

## 2016-11-01 DIAGNOSIS — N183 Chronic kidney disease, stage 3 (moderate): Secondary | ICD-10-CM | POA: Diagnosis not present

## 2016-11-01 DIAGNOSIS — D649 Anemia, unspecified: Secondary | ICD-10-CM | POA: Diagnosis not present

## 2016-11-02 DIAGNOSIS — D539 Nutritional anemia, unspecified: Secondary | ICD-10-CM | POA: Diagnosis not present

## 2016-11-02 DIAGNOSIS — D509 Iron deficiency anemia, unspecified: Secondary | ICD-10-CM | POA: Diagnosis not present

## 2016-11-08 ENCOUNTER — Other Ambulatory Visit: Payer: Self-pay

## 2016-11-08 DIAGNOSIS — M79605 Pain in left leg: Secondary | ICD-10-CM | POA: Diagnosis not present

## 2016-11-08 DIAGNOSIS — D649 Anemia, unspecified: Secondary | ICD-10-CM | POA: Diagnosis not present

## 2016-11-08 DIAGNOSIS — M7989 Other specified soft tissue disorders: Secondary | ICD-10-CM | POA: Diagnosis not present

## 2016-11-08 DIAGNOSIS — E538 Deficiency of other specified B group vitamins: Secondary | ICD-10-CM | POA: Diagnosis not present

## 2016-11-08 DIAGNOSIS — R600 Localized edema: Secondary | ICD-10-CM | POA: Diagnosis not present

## 2016-11-08 NOTE — Patient Outreach (Signed)
[facility]) Care Management  11/08/2016  Mario Small [birth year] [id number]  REFERRAL DATE; 11/02/16 REFERRAL SOURCE: [employer] / [employer] REFERRAL REASON:  4 inpatient  In last 6 months.  Patient has had blood transfusion every month since July 2017.  Assess for ongoing needs  SUBJECTIVE; Telephone call to patient regarding [employer] referral. Unable to reach patient. HIPAA compliant voice message left with call back phone number.    PLAN; RNCM will attempt 2nd telephone outreach to patient within 1 week.  Quinn Plowman RN,BSN,CCM [facility] Telephonic  [phone]

## 2016-11-11 ENCOUNTER — Other Ambulatory Visit: Payer: Self-pay

## 2016-11-11 NOTE — Patient Outreach (Signed)
[facility]) Care Management  11/11/2016  RIGO LARKE 02/27/1933 [id number]  Telephone call to patient regarding [employer] referral.  HIPAA verified with patient. Patient request RNCM speak with his wife.  Patient gave verbal consent to speak with his wife regarding all of his health information. Discussed and offered [facility] care management program.  Wife refused program at this time. Wife states patient has been referred to Dr. Bobby Rumpf, oncologist/hematologist.  Wife states doctor is in the process of trying to figure out what is going on with patient and why he continues to lose blood. Wife states he has a follow up on 11/17/16 with Dr. Bobby Rumpf.  States at that time they should know how to proceed with patients care. Wife requested contact phone number for [facility] care management.  Number sent to patient/ wife.    ASSESSMENT: [employer] referral  PLAN; RNCM will refer patient to case management assistant to close due to refusal of services.  RNCM will notify patients primary Md of closure RNCM will send patient [facility] care management outreach letter and brochure.   Quinn Plowman RN,BSN,CCM [facility] Telephonic  [phone]

## 2016-11-12 DIAGNOSIS — E7800 Pure hypercholesterolemia, unspecified: Secondary | ICD-10-CM | POA: Diagnosis not present

## 2016-11-12 DIAGNOSIS — I252 Old myocardial infarction: Secondary | ICD-10-CM | POA: Diagnosis not present

## 2016-11-12 DIAGNOSIS — R531 Weakness: Secondary | ICD-10-CM | POA: Diagnosis not present

## 2016-11-12 DIAGNOSIS — I4891 Unspecified atrial fibrillation: Secondary | ICD-10-CM | POA: Diagnosis not present

## 2016-11-12 DIAGNOSIS — D649 Anemia, unspecified: Secondary | ICD-10-CM | POA: Diagnosis not present

## 2016-11-12 DIAGNOSIS — Z955 Presence of coronary angioplasty implant and graft: Secondary | ICD-10-CM | POA: Diagnosis not present

## 2016-11-12 DIAGNOSIS — I2510 Atherosclerotic heart disease of native coronary artery without angina pectoris: Secondary | ICD-10-CM | POA: Diagnosis not present

## 2016-11-12 DIAGNOSIS — I10 Essential (primary) hypertension: Secondary | ICD-10-CM | POA: Diagnosis not present

## 2016-11-13 DIAGNOSIS — N189 Chronic kidney disease, unspecified: Secondary | ICD-10-CM | POA: Diagnosis not present

## 2016-11-13 DIAGNOSIS — Z955 Presence of coronary angioplasty implant and graft: Secondary | ICD-10-CM | POA: Diagnosis not present

## 2016-11-13 DIAGNOSIS — I129 Hypertensive chronic kidney disease with stage 1 through stage 4 chronic kidney disease, or unspecified chronic kidney disease: Secondary | ICD-10-CM | POA: Diagnosis not present

## 2016-11-13 DIAGNOSIS — Z7982 Long term (current) use of aspirin: Secondary | ICD-10-CM | POA: Diagnosis not present

## 2016-11-13 DIAGNOSIS — Z8673 Personal history of transient ischemic attack (TIA), and cerebral infarction without residual deficits: Secondary | ICD-10-CM | POA: Diagnosis not present

## 2016-11-13 DIAGNOSIS — M7989 Other specified soft tissue disorders: Secondary | ICD-10-CM | POA: Diagnosis not present

## 2016-11-13 DIAGNOSIS — R079 Chest pain, unspecified: Secondary | ICD-10-CM | POA: Diagnosis not present

## 2016-11-13 DIAGNOSIS — D649 Anemia, unspecified: Secondary | ICD-10-CM | POA: Diagnosis not present

## 2016-11-13 DIAGNOSIS — E039 Hypothyroidism, unspecified: Secondary | ICD-10-CM | POA: Diagnosis not present

## 2016-11-13 DIAGNOSIS — Z87891 Personal history of nicotine dependence: Secondary | ICD-10-CM | POA: Diagnosis not present

## 2016-11-13 DIAGNOSIS — E1122 Type 2 diabetes mellitus with diabetic chronic kidney disease: Secondary | ICD-10-CM | POA: Diagnosis not present

## 2016-11-13 DIAGNOSIS — I252 Old myocardial infarction: Secondary | ICD-10-CM | POA: Diagnosis not present

## 2016-11-13 DIAGNOSIS — R0602 Shortness of breath: Secondary | ICD-10-CM | POA: Diagnosis not present

## 2016-11-13 DIAGNOSIS — I2510 Atherosclerotic heart disease of native coronary artery without angina pectoris: Secondary | ICD-10-CM | POA: Diagnosis not present

## 2016-11-14 DIAGNOSIS — I110 Hypertensive heart disease with heart failure: Secondary | ICD-10-CM | POA: Diagnosis not present

## 2016-11-14 DIAGNOSIS — I5033 Acute on chronic diastolic (congestive) heart failure: Secondary | ICD-10-CM | POA: Diagnosis not present

## 2016-11-14 DIAGNOSIS — N189 Chronic kidney disease, unspecified: Secondary | ICD-10-CM | POA: Diagnosis not present

## 2016-11-14 DIAGNOSIS — E782 Mixed hyperlipidemia: Secondary | ICD-10-CM | POA: Diagnosis not present

## 2016-11-14 DIAGNOSIS — R0602 Shortness of breath: Secondary | ICD-10-CM | POA: Diagnosis not present

## 2016-11-14 DIAGNOSIS — E1121 Type 2 diabetes mellitus with diabetic nephropathy: Secondary | ICD-10-CM | POA: Diagnosis not present

## 2016-11-14 DIAGNOSIS — N179 Acute kidney failure, unspecified: Secondary | ICD-10-CM | POA: Diagnosis not present

## 2016-11-14 DIAGNOSIS — I252 Old myocardial infarction: Secondary | ICD-10-CM | POA: Diagnosis not present

## 2016-11-14 DIAGNOSIS — I2510 Atherosclerotic heart disease of native coronary artery without angina pectoris: Secondary | ICD-10-CM | POA: Diagnosis not present

## 2016-11-14 DIAGNOSIS — R0600 Dyspnea, unspecified: Secondary | ICD-10-CM | POA: Diagnosis not present

## 2016-11-14 DIAGNOSIS — I350 Nonrheumatic aortic (valve) stenosis: Secondary | ICD-10-CM | POA: Diagnosis not present

## 2016-11-14 DIAGNOSIS — D649 Anemia, unspecified: Secondary | ICD-10-CM | POA: Diagnosis not present

## 2016-11-14 DIAGNOSIS — I509 Heart failure, unspecified: Secondary | ICD-10-CM | POA: Diagnosis not present

## 2016-11-14 DIAGNOSIS — N183 Chronic kidney disease, stage 3 (moderate): Secondary | ICD-10-CM | POA: Diagnosis not present

## 2016-11-14 DIAGNOSIS — I480 Paroxysmal atrial fibrillation: Secondary | ICD-10-CM | POA: Diagnosis not present

## 2016-11-14 DIAGNOSIS — E872 Acidosis: Secondary | ICD-10-CM | POA: Diagnosis not present

## 2016-11-14 DIAGNOSIS — I25119 Atherosclerotic heart disease of native coronary artery with unspecified angina pectoris: Secondary | ICD-10-CM | POA: Diagnosis not present

## 2016-11-14 DIAGNOSIS — I5030 Unspecified diastolic (congestive) heart failure: Secondary | ICD-10-CM | POA: Diagnosis not present

## 2016-11-14 DIAGNOSIS — I119 Hypertensive heart disease without heart failure: Secondary | ICD-10-CM | POA: Diagnosis not present

## 2016-11-14 DIAGNOSIS — R918 Other nonspecific abnormal finding of lung field: Secondary | ICD-10-CM | POA: Diagnosis not present

## 2016-11-14 DIAGNOSIS — Z7982 Long term (current) use of aspirin: Secondary | ICD-10-CM | POA: Diagnosis not present

## 2016-11-14 DIAGNOSIS — R601 Generalized edema: Secondary | ICD-10-CM | POA: Diagnosis not present

## 2016-11-14 DIAGNOSIS — Z952 Presence of prosthetic heart valve: Secondary | ICD-10-CM | POA: Diagnosis not present

## 2016-11-14 DIAGNOSIS — R0902 Hypoxemia: Secondary | ICD-10-CM | POA: Diagnosis not present

## 2016-11-14 DIAGNOSIS — F17211 Nicotine dependence, cigarettes, in remission: Secondary | ICD-10-CM | POA: Diagnosis not present

## 2016-11-14 DIAGNOSIS — E1122 Type 2 diabetes mellitus with diabetic chronic kidney disease: Secondary | ICD-10-CM | POA: Diagnosis not present

## 2016-11-14 DIAGNOSIS — J449 Chronic obstructive pulmonary disease, unspecified: Secondary | ICD-10-CM | POA: Diagnosis not present

## 2016-11-14 DIAGNOSIS — N182 Chronic kidney disease, stage 2 (mild): Secondary | ICD-10-CM | POA: Diagnosis not present

## 2016-11-14 DIAGNOSIS — R942 Abnormal results of pulmonary function studies: Secondary | ICD-10-CM | POA: Diagnosis not present

## 2016-11-14 DIAGNOSIS — I130 Hypertensive heart and chronic kidney disease with heart failure and stage 1 through stage 4 chronic kidney disease, or unspecified chronic kidney disease: Secondary | ICD-10-CM | POA: Diagnosis not present

## 2016-11-14 DIAGNOSIS — Z8673 Personal history of transient ischemic attack (TIA), and cerebral infarction without residual deficits: Secondary | ICD-10-CM | POA: Diagnosis not present

## 2016-11-14 DIAGNOSIS — D72829 Elevated white blood cell count, unspecified: Secondary | ICD-10-CM | POA: Diagnosis not present

## 2016-11-14 DIAGNOSIS — Z87891 Personal history of nicotine dependence: Secondary | ICD-10-CM | POA: Diagnosis not present

## 2016-11-14 DIAGNOSIS — Z794 Long term (current) use of insulin: Secondary | ICD-10-CM | POA: Diagnosis not present

## 2016-11-14 DIAGNOSIS — Z955 Presence of coronary angioplasty implant and graft: Secondary | ICD-10-CM | POA: Diagnosis not present

## 2016-11-14 DIAGNOSIS — E039 Hypothyroidism, unspecified: Secondary | ICD-10-CM | POA: Diagnosis not present

## 2016-11-15 DIAGNOSIS — J189 Pneumonia, unspecified organism: Secondary | ICD-10-CM | POA: Diagnosis not present

## 2016-11-15 DIAGNOSIS — I2510 Atherosclerotic heart disease of native coronary artery without angina pectoris: Secondary | ICD-10-CM | POA: Diagnosis not present

## 2016-11-15 DIAGNOSIS — I130 Hypertensive heart and chronic kidney disease with heart failure and stage 1 through stage 4 chronic kidney disease, or unspecified chronic kidney disease: Secondary | ICD-10-CM | POA: Diagnosis not present

## 2016-11-15 DIAGNOSIS — D649 Anemia, unspecified: Secondary | ICD-10-CM | POA: Diagnosis not present

## 2016-11-15 DIAGNOSIS — R062 Wheezing: Secondary | ICD-10-CM | POA: Diagnosis not present

## 2016-11-15 DIAGNOSIS — I358 Other nonrheumatic aortic valve disorders: Secondary | ICD-10-CM | POA: Diagnosis not present

## 2016-11-15 DIAGNOSIS — N189 Chronic kidney disease, unspecified: Secondary | ICD-10-CM | POA: Diagnosis not present

## 2016-11-15 DIAGNOSIS — E872 Acidosis: Secondary | ICD-10-CM | POA: Diagnosis not present

## 2016-11-15 DIAGNOSIS — N183 Chronic kidney disease, stage 3 (moderate): Secondary | ICD-10-CM | POA: Diagnosis not present

## 2016-11-15 DIAGNOSIS — I509 Heart failure, unspecified: Secondary | ICD-10-CM | POA: Diagnosis not present

## 2016-11-15 DIAGNOSIS — I1310 Hypertensive heart and chronic kidney disease without heart failure, with stage 1 through stage 4 chronic kidney disease, or unspecified chronic kidney disease: Secondary | ICD-10-CM | POA: Diagnosis not present

## 2016-11-15 DIAGNOSIS — E1122 Type 2 diabetes mellitus with diabetic chronic kidney disease: Secondary | ICD-10-CM | POA: Diagnosis not present

## 2016-11-15 DIAGNOSIS — D72829 Elevated white blood cell count, unspecified: Secondary | ICD-10-CM | POA: Diagnosis not present

## 2016-11-15 DIAGNOSIS — I350 Nonrheumatic aortic (valve) stenosis: Secondary | ICD-10-CM | POA: Diagnosis not present

## 2016-11-15 DIAGNOSIS — Z952 Presence of prosthetic heart valve: Secondary | ICD-10-CM | POA: Diagnosis not present

## 2016-11-16 DIAGNOSIS — D649 Anemia, unspecified: Secondary | ICD-10-CM | POA: Diagnosis not present

## 2016-11-16 DIAGNOSIS — E1169 Type 2 diabetes mellitus with other specified complication: Secondary | ICD-10-CM | POA: Diagnosis not present

## 2016-11-16 DIAGNOSIS — R918 Other nonspecific abnormal finding of lung field: Secondary | ICD-10-CM | POA: Diagnosis not present

## 2016-11-16 DIAGNOSIS — D72829 Elevated white blood cell count, unspecified: Secondary | ICD-10-CM | POA: Diagnosis not present

## 2016-11-16 DIAGNOSIS — E1122 Type 2 diabetes mellitus with diabetic chronic kidney disease: Secondary | ICD-10-CM | POA: Diagnosis not present

## 2016-11-16 DIAGNOSIS — R0602 Shortness of breath: Secondary | ICD-10-CM | POA: Diagnosis not present

## 2016-11-16 DIAGNOSIS — I509 Heart failure, unspecified: Secondary | ICD-10-CM | POA: Diagnosis not present

## 2016-11-16 DIAGNOSIS — I480 Paroxysmal atrial fibrillation: Secondary | ICD-10-CM | POA: Diagnosis not present

## 2016-11-16 DIAGNOSIS — N179 Acute kidney failure, unspecified: Secondary | ICD-10-CM | POA: Diagnosis not present

## 2016-11-16 DIAGNOSIS — J189 Pneumonia, unspecified organism: Secondary | ICD-10-CM | POA: Diagnosis not present

## 2016-11-16 DIAGNOSIS — R062 Wheezing: Secondary | ICD-10-CM | POA: Diagnosis not present

## 2016-11-16 DIAGNOSIS — N189 Chronic kidney disease, unspecified: Secondary | ICD-10-CM | POA: Diagnosis not present

## 2016-11-16 DIAGNOSIS — I2510 Atherosclerotic heart disease of native coronary artery without angina pectoris: Secondary | ICD-10-CM | POA: Diagnosis not present

## 2016-11-16 DIAGNOSIS — R609 Edema, unspecified: Secondary | ICD-10-CM | POA: Diagnosis not present

## 2016-11-16 DIAGNOSIS — I348 Other nonrheumatic mitral valve disorders: Secondary | ICD-10-CM | POA: Diagnosis not present

## 2016-11-16 DIAGNOSIS — I130 Hypertensive heart and chronic kidney disease with heart failure and stage 1 through stage 4 chronic kidney disease, or unspecified chronic kidney disease: Secondary | ICD-10-CM | POA: Diagnosis not present

## 2016-11-16 DIAGNOSIS — I350 Nonrheumatic aortic (valve) stenosis: Secondary | ICD-10-CM | POA: Diagnosis not present

## 2016-11-16 DIAGNOSIS — Z952 Presence of prosthetic heart valve: Secondary | ICD-10-CM | POA: Diagnosis not present

## 2016-11-16 DIAGNOSIS — N183 Chronic kidney disease, stage 3 (moderate): Secondary | ICD-10-CM | POA: Diagnosis not present

## 2016-11-17 DIAGNOSIS — I130 Hypertensive heart and chronic kidney disease with heart failure and stage 1 through stage 4 chronic kidney disease, or unspecified chronic kidney disease: Secondary | ICD-10-CM | POA: Diagnosis not present

## 2016-11-17 DIAGNOSIS — N179 Acute kidney failure, unspecified: Secondary | ICD-10-CM | POA: Diagnosis not present

## 2016-11-17 DIAGNOSIS — E872 Acidosis: Secondary | ICD-10-CM | POA: Diagnosis not present

## 2016-11-17 DIAGNOSIS — J189 Pneumonia, unspecified organism: Secondary | ICD-10-CM | POA: Diagnosis not present

## 2016-11-17 DIAGNOSIS — I2510 Atherosclerotic heart disease of native coronary artery without angina pectoris: Secondary | ICD-10-CM | POA: Diagnosis not present

## 2016-11-17 DIAGNOSIS — R918 Other nonspecific abnormal finding of lung field: Secondary | ICD-10-CM | POA: Diagnosis not present

## 2016-11-17 DIAGNOSIS — I129 Hypertensive chronic kidney disease with stage 1 through stage 4 chronic kidney disease, or unspecified chronic kidney disease: Secondary | ICD-10-CM | POA: Diagnosis not present

## 2016-11-17 DIAGNOSIS — E1169 Type 2 diabetes mellitus with other specified complication: Secondary | ICD-10-CM | POA: Diagnosis not present

## 2016-11-17 DIAGNOSIS — D72829 Elevated white blood cell count, unspecified: Secondary | ICD-10-CM | POA: Diagnosis not present

## 2016-11-17 DIAGNOSIS — R062 Wheezing: Secondary | ICD-10-CM | POA: Diagnosis not present

## 2016-11-17 DIAGNOSIS — D649 Anemia, unspecified: Secondary | ICD-10-CM | POA: Diagnosis not present

## 2016-11-17 DIAGNOSIS — R0602 Shortness of breath: Secondary | ICD-10-CM | POA: Diagnosis not present

## 2016-11-17 DIAGNOSIS — I509 Heart failure, unspecified: Secondary | ICD-10-CM | POA: Diagnosis not present

## 2016-11-17 DIAGNOSIS — N183 Chronic kidney disease, stage 3 (moderate): Secondary | ICD-10-CM | POA: Diagnosis not present

## 2016-11-17 DIAGNOSIS — E1122 Type 2 diabetes mellitus with diabetic chronic kidney disease: Secondary | ICD-10-CM | POA: Diagnosis not present

## 2016-11-17 DIAGNOSIS — N189 Chronic kidney disease, unspecified: Secondary | ICD-10-CM | POA: Diagnosis not present

## 2016-11-18 DIAGNOSIS — I130 Hypertensive heart and chronic kidney disease with heart failure and stage 1 through stage 4 chronic kidney disease, or unspecified chronic kidney disease: Secondary | ICD-10-CM | POA: Diagnosis not present

## 2016-11-18 DIAGNOSIS — I480 Paroxysmal atrial fibrillation: Secondary | ICD-10-CM | POA: Diagnosis not present

## 2016-11-18 DIAGNOSIS — R062 Wheezing: Secondary | ICD-10-CM | POA: Diagnosis not present

## 2016-11-18 DIAGNOSIS — D649 Anemia, unspecified: Secondary | ICD-10-CM | POA: Diagnosis not present

## 2016-11-18 DIAGNOSIS — I350 Nonrheumatic aortic (valve) stenosis: Secondary | ICD-10-CM | POA: Diagnosis not present

## 2016-11-18 DIAGNOSIS — I25118 Atherosclerotic heart disease of native coronary artery with other forms of angina pectoris: Secondary | ICD-10-CM | POA: Diagnosis not present

## 2016-11-18 DIAGNOSIS — I509 Heart failure, unspecified: Secondary | ICD-10-CM | POA: Diagnosis not present

## 2016-11-18 DIAGNOSIS — D72829 Elevated white blood cell count, unspecified: Secondary | ICD-10-CM | POA: Diagnosis not present

## 2016-11-18 DIAGNOSIS — E782 Mixed hyperlipidemia: Secondary | ICD-10-CM | POA: Diagnosis not present

## 2016-11-18 DIAGNOSIS — N183 Chronic kidney disease, stage 3 (moderate): Secondary | ICD-10-CM | POA: Diagnosis not present

## 2016-11-19 DIAGNOSIS — I5089 Other heart failure: Secondary | ICD-10-CM | POA: Diagnosis not present

## 2016-11-19 DIAGNOSIS — J449 Chronic obstructive pulmonary disease, unspecified: Secondary | ICD-10-CM | POA: Diagnosis not present

## 2016-11-26 DIAGNOSIS — I5031 Acute diastolic (congestive) heart failure: Secondary | ICD-10-CM | POA: Diagnosis not present

## 2016-11-26 DIAGNOSIS — J449 Chronic obstructive pulmonary disease, unspecified: Secondary | ICD-10-CM | POA: Diagnosis not present

## 2016-11-26 DIAGNOSIS — D509 Iron deficiency anemia, unspecified: Secondary | ICD-10-CM | POA: Diagnosis not present

## 2016-11-26 DIAGNOSIS — Z6832 Body mass index (BMI) 32.0-32.9, adult: Secondary | ICD-10-CM | POA: Diagnosis not present

## 2016-11-26 DIAGNOSIS — J189 Pneumonia, unspecified organism: Secondary | ICD-10-CM | POA: Diagnosis not present

## 2016-11-26 DIAGNOSIS — I129 Hypertensive chronic kidney disease with stage 1 through stage 4 chronic kidney disease, or unspecified chronic kidney disease: Secondary | ICD-10-CM | POA: Diagnosis not present

## 2016-11-26 DIAGNOSIS — Z79899 Other long term (current) drug therapy: Secondary | ICD-10-CM | POA: Diagnosis not present

## 2016-11-29 DIAGNOSIS — E875 Hyperkalemia: Secondary | ICD-10-CM | POA: Diagnosis not present

## 2016-11-30 DIAGNOSIS — D509 Iron deficiency anemia, unspecified: Secondary | ICD-10-CM | POA: Diagnosis not present

## 2016-12-02 DIAGNOSIS — Z6832 Body mass index (BMI) 32.0-32.9, adult: Secondary | ICD-10-CM | POA: Diagnosis not present

## 2016-12-02 DIAGNOSIS — M10371 Gout due to renal impairment, right ankle and foot: Secondary | ICD-10-CM | POA: Diagnosis not present

## 2016-12-02 DIAGNOSIS — L0231 Cutaneous abscess of buttock: Secondary | ICD-10-CM | POA: Diagnosis not present

## 2016-12-07 DIAGNOSIS — L02416 Cutaneous abscess of left lower limb: Secondary | ICD-10-CM | POA: Diagnosis not present

## 2016-12-07 DIAGNOSIS — D509 Iron deficiency anemia, unspecified: Secondary | ICD-10-CM | POA: Diagnosis not present

## 2016-12-08 DIAGNOSIS — E785 Hyperlipidemia, unspecified: Secondary | ICD-10-CM | POA: Diagnosis not present

## 2016-12-08 DIAGNOSIS — Z952 Presence of prosthetic heart valve: Secondary | ICD-10-CM | POA: Diagnosis not present

## 2016-12-08 DIAGNOSIS — I509 Heart failure, unspecified: Secondary | ICD-10-CM | POA: Diagnosis not present

## 2016-12-08 DIAGNOSIS — Z9981 Dependence on supplemental oxygen: Secondary | ICD-10-CM | POA: Diagnosis not present

## 2016-12-08 DIAGNOSIS — E1122 Type 2 diabetes mellitus with diabetic chronic kidney disease: Secondary | ICD-10-CM | POA: Diagnosis not present

## 2016-12-08 DIAGNOSIS — I130 Hypertensive heart and chronic kidney disease with heart failure and stage 1 through stage 4 chronic kidney disease, or unspecified chronic kidney disease: Secondary | ICD-10-CM | POA: Diagnosis not present

## 2016-12-08 DIAGNOSIS — D631 Anemia in chronic kidney disease: Secondary | ICD-10-CM | POA: Diagnosis not present

## 2016-12-08 DIAGNOSIS — Z955 Presence of coronary angioplasty implant and graft: Secondary | ICD-10-CM | POA: Diagnosis not present

## 2016-12-08 DIAGNOSIS — Z7982 Long term (current) use of aspirin: Secondary | ICD-10-CM | POA: Diagnosis not present

## 2016-12-08 DIAGNOSIS — I480 Paroxysmal atrial fibrillation: Secondary | ICD-10-CM | POA: Diagnosis not present

## 2016-12-08 DIAGNOSIS — Z6830 Body mass index (BMI) 30.0-30.9, adult: Secondary | ICD-10-CM | POA: Diagnosis not present

## 2016-12-08 DIAGNOSIS — L02416 Cutaneous abscess of left lower limb: Secondary | ICD-10-CM | POA: Diagnosis not present

## 2016-12-08 DIAGNOSIS — M109 Gout, unspecified: Secondary | ICD-10-CM | POA: Diagnosis not present

## 2016-12-08 DIAGNOSIS — J449 Chronic obstructive pulmonary disease, unspecified: Secondary | ICD-10-CM | POA: Diagnosis not present

## 2016-12-08 DIAGNOSIS — Z8673 Personal history of transient ischemic attack (TIA), and cerebral infarction without residual deficits: Secondary | ICD-10-CM | POA: Diagnosis not present

## 2016-12-08 DIAGNOSIS — Z7984 Long term (current) use of oral hypoglycemic drugs: Secondary | ICD-10-CM | POA: Diagnosis not present

## 2016-12-08 DIAGNOSIS — N183 Chronic kidney disease, stage 3 (moderate): Secondary | ICD-10-CM | POA: Diagnosis not present

## 2016-12-20 DIAGNOSIS — J449 Chronic obstructive pulmonary disease, unspecified: Secondary | ICD-10-CM | POA: Diagnosis not present

## 2016-12-20 DIAGNOSIS — I5089 Other heart failure: Secondary | ICD-10-CM | POA: Diagnosis not present

## 2016-12-27 DIAGNOSIS — D509 Iron deficiency anemia, unspecified: Secondary | ICD-10-CM | POA: Diagnosis not present

## 2016-12-27 DIAGNOSIS — I5032 Chronic diastolic (congestive) heart failure: Secondary | ICD-10-CM | POA: Diagnosis not present

## 2016-12-27 DIAGNOSIS — M109 Gout, unspecified: Secondary | ICD-10-CM | POA: Diagnosis not present

## 2016-12-27 DIAGNOSIS — I129 Hypertensive chronic kidney disease with stage 1 through stage 4 chronic kidney disease, or unspecified chronic kidney disease: Secondary | ICD-10-CM | POA: Diagnosis not present

## 2017-01-04 DIAGNOSIS — R509 Fever, unspecified: Secondary | ICD-10-CM | POA: Diagnosis not present

## 2017-01-04 DIAGNOSIS — N3090 Cystitis, unspecified without hematuria: Secondary | ICD-10-CM | POA: Diagnosis not present

## 2017-01-04 DIAGNOSIS — J189 Pneumonia, unspecified organism: Secondary | ICD-10-CM | POA: Diagnosis not present

## 2017-01-04 DIAGNOSIS — N3001 Acute cystitis with hematuria: Secondary | ICD-10-CM | POA: Diagnosis not present

## 2017-01-18 DIAGNOSIS — N182 Chronic kidney disease, stage 2 (mild): Secondary | ICD-10-CM | POA: Diagnosis not present

## 2017-01-18 DIAGNOSIS — R309 Painful micturition, unspecified: Secondary | ICD-10-CM | POA: Diagnosis not present

## 2017-01-18 DIAGNOSIS — I10 Essential (primary) hypertension: Secondary | ICD-10-CM | POA: Diagnosis not present

## 2017-01-18 DIAGNOSIS — M109 Gout, unspecified: Secondary | ICD-10-CM | POA: Diagnosis not present

## 2017-01-18 DIAGNOSIS — E119 Type 2 diabetes mellitus without complications: Secondary | ICD-10-CM | POA: Diagnosis not present

## 2017-01-19 DIAGNOSIS — J449 Chronic obstructive pulmonary disease, unspecified: Secondary | ICD-10-CM | POA: Diagnosis not present

## 2017-01-19 DIAGNOSIS — I5089 Other heart failure: Secondary | ICD-10-CM | POA: Diagnosis not present

## 2017-01-24 DIAGNOSIS — E1129 Type 2 diabetes mellitus with other diabetic kidney complication: Secondary | ICD-10-CM | POA: Diagnosis not present

## 2017-01-24 DIAGNOSIS — Z6832 Body mass index (BMI) 32.0-32.9, adult: Secondary | ICD-10-CM | POA: Diagnosis not present

## 2017-01-24 DIAGNOSIS — I129 Hypertensive chronic kidney disease with stage 1 through stage 4 chronic kidney disease, or unspecified chronic kidney disease: Secondary | ICD-10-CM | POA: Diagnosis not present

## 2017-01-24 DIAGNOSIS — E039 Hypothyroidism, unspecified: Secondary | ICD-10-CM | POA: Diagnosis not present

## 2017-01-24 DIAGNOSIS — D509 Iron deficiency anemia, unspecified: Secondary | ICD-10-CM | POA: Diagnosis not present

## 2017-01-24 DIAGNOSIS — E785 Hyperlipidemia, unspecified: Secondary | ICD-10-CM | POA: Diagnosis not present

## 2017-01-31 DIAGNOSIS — I480 Paroxysmal atrial fibrillation: Secondary | ICD-10-CM | POA: Diagnosis not present

## 2017-01-31 DIAGNOSIS — I25118 Atherosclerotic heart disease of native coronary artery with other forms of angina pectoris: Secondary | ICD-10-CM | POA: Diagnosis not present

## 2017-01-31 DIAGNOSIS — Z952 Presence of prosthetic heart valve: Secondary | ICD-10-CM | POA: Diagnosis not present

## 2017-02-04 DIAGNOSIS — N189 Chronic kidney disease, unspecified: Secondary | ICD-10-CM | POA: Diagnosis not present

## 2017-02-04 DIAGNOSIS — D631 Anemia in chronic kidney disease: Secondary | ICD-10-CM | POA: Diagnosis not present

## 2017-02-04 DIAGNOSIS — D509 Iron deficiency anemia, unspecified: Secondary | ICD-10-CM | POA: Diagnosis not present

## 2017-02-10 DIAGNOSIS — N189 Chronic kidney disease, unspecified: Secondary | ICD-10-CM | POA: Diagnosis not present

## 2017-02-10 DIAGNOSIS — D631 Anemia in chronic kidney disease: Secondary | ICD-10-CM | POA: Diagnosis not present

## 2017-02-15 DIAGNOSIS — Z6833 Body mass index (BMI) 33.0-33.9, adult: Secondary | ICD-10-CM | POA: Diagnosis not present

## 2017-02-15 DIAGNOSIS — I129 Hypertensive chronic kidney disease with stage 1 through stage 4 chronic kidney disease, or unspecified chronic kidney disease: Secondary | ICD-10-CM | POA: Diagnosis not present

## 2017-02-15 DIAGNOSIS — Z139 Encounter for screening, unspecified: Secondary | ICD-10-CM | POA: Diagnosis not present

## 2017-02-15 DIAGNOSIS — M109 Gout, unspecified: Secondary | ICD-10-CM | POA: Diagnosis not present

## 2017-02-17 DIAGNOSIS — J449 Chronic obstructive pulmonary disease, unspecified: Secondary | ICD-10-CM | POA: Diagnosis not present

## 2017-02-17 DIAGNOSIS — I5089 Other heart failure: Secondary | ICD-10-CM | POA: Diagnosis not present

## 2017-03-10 DIAGNOSIS — D649 Anemia, unspecified: Secondary | ICD-10-CM | POA: Diagnosis not present

## 2017-03-15 DIAGNOSIS — I129 Hypertensive chronic kidney disease with stage 1 through stage 4 chronic kidney disease, or unspecified chronic kidney disease: Secondary | ICD-10-CM | POA: Diagnosis not present

## 2017-03-15 DIAGNOSIS — Z6833 Body mass index (BMI) 33.0-33.9, adult: Secondary | ICD-10-CM | POA: Diagnosis not present

## 2017-03-16 DIAGNOSIS — E119 Type 2 diabetes mellitus without complications: Secondary | ICD-10-CM | POA: Diagnosis not present

## 2017-03-16 DIAGNOSIS — H52223 Regular astigmatism, bilateral: Secondary | ICD-10-CM | POA: Diagnosis not present

## 2017-03-16 DIAGNOSIS — H35033 Hypertensive retinopathy, bilateral: Secondary | ICD-10-CM | POA: Diagnosis not present

## 2017-03-20 DIAGNOSIS — J449 Chronic obstructive pulmonary disease, unspecified: Secondary | ICD-10-CM | POA: Diagnosis not present

## 2017-03-20 DIAGNOSIS — I5089 Other heart failure: Secondary | ICD-10-CM | POA: Diagnosis not present

## 2017-04-05 DIAGNOSIS — R52 Pain, unspecified: Secondary | ICD-10-CM | POA: Diagnosis not present

## 2017-04-05 DIAGNOSIS — N183 Chronic kidney disease, stage 3 (moderate): Secondary | ICD-10-CM | POA: Diagnosis not present

## 2017-04-05 DIAGNOSIS — D509 Iron deficiency anemia, unspecified: Secondary | ICD-10-CM | POA: Diagnosis not present

## 2017-04-05 DIAGNOSIS — Z6833 Body mass index (BMI) 33.0-33.9, adult: Secondary | ICD-10-CM | POA: Diagnosis not present

## 2017-04-08 DIAGNOSIS — D649 Anemia, unspecified: Secondary | ICD-10-CM | POA: Diagnosis not present

## 2017-04-11 DIAGNOSIS — D649 Anemia, unspecified: Secondary | ICD-10-CM | POA: Diagnosis not present

## 2017-04-12 DIAGNOSIS — Z139 Encounter for screening, unspecified: Secondary | ICD-10-CM | POA: Diagnosis not present

## 2017-04-12 DIAGNOSIS — I129 Hypertensive chronic kidney disease with stage 1 through stage 4 chronic kidney disease, or unspecified chronic kidney disease: Secondary | ICD-10-CM | POA: Diagnosis not present

## 2017-04-12 DIAGNOSIS — Z6833 Body mass index (BMI) 33.0-33.9, adult: Secondary | ICD-10-CM | POA: Diagnosis not present

## 2017-04-15 DIAGNOSIS — M62830 Muscle spasm of back: Secondary | ICD-10-CM | POA: Diagnosis not present

## 2017-04-15 DIAGNOSIS — M546 Pain in thoracic spine: Secondary | ICD-10-CM | POA: Diagnosis not present

## 2017-04-18 DIAGNOSIS — R0600 Dyspnea, unspecified: Secondary | ICD-10-CM | POA: Diagnosis not present

## 2017-04-18 DIAGNOSIS — M549 Dorsalgia, unspecified: Secondary | ICD-10-CM | POA: Diagnosis not present

## 2017-04-18 DIAGNOSIS — I129 Hypertensive chronic kidney disease with stage 1 through stage 4 chronic kidney disease, or unspecified chronic kidney disease: Secondary | ICD-10-CM | POA: Diagnosis not present

## 2017-04-18 DIAGNOSIS — N189 Chronic kidney disease, unspecified: Secondary | ICD-10-CM | POA: Diagnosis not present

## 2017-04-18 DIAGNOSIS — R509 Fever, unspecified: Secondary | ICD-10-CM | POA: Diagnosis not present

## 2017-04-18 DIAGNOSIS — J439 Emphysema, unspecified: Secondary | ICD-10-CM | POA: Diagnosis not present

## 2017-04-18 DIAGNOSIS — R0902 Hypoxemia: Secondary | ICD-10-CM | POA: Diagnosis not present

## 2017-04-18 DIAGNOSIS — D649 Anemia, unspecified: Secondary | ICD-10-CM | POA: Diagnosis not present

## 2017-04-18 DIAGNOSIS — M546 Pain in thoracic spine: Secondary | ICD-10-CM | POA: Diagnosis not present

## 2017-04-18 DIAGNOSIS — R7881 Bacteremia: Secondary | ICD-10-CM | POA: Diagnosis not present

## 2017-04-19 ENCOUNTER — Other Ambulatory Visit: Payer: Self-pay

## 2017-04-19 DIAGNOSIS — E1122 Type 2 diabetes mellitus with diabetic chronic kidney disease: Secondary | ICD-10-CM | POA: Diagnosis not present

## 2017-04-19 DIAGNOSIS — I330 Acute and subacute infective endocarditis: Secondary | ICD-10-CM | POA: Diagnosis not present

## 2017-04-19 DIAGNOSIS — M5124 Other intervertebral disc displacement, thoracic region: Secondary | ICD-10-CM | POA: Diagnosis not present

## 2017-04-19 DIAGNOSIS — G92 Toxic encephalopathy: Secondary | ICD-10-CM | POA: Diagnosis not present

## 2017-04-19 DIAGNOSIS — I252 Old myocardial infarction: Secondary | ICD-10-CM | POA: Diagnosis not present

## 2017-04-19 DIAGNOSIS — D696 Thrombocytopenia, unspecified: Secondary | ICD-10-CM | POA: Diagnosis not present

## 2017-04-19 DIAGNOSIS — I129 Hypertensive chronic kidney disease with stage 1 through stage 4 chronic kidney disease, or unspecified chronic kidney disease: Secondary | ICD-10-CM | POA: Diagnosis not present

## 2017-04-19 DIAGNOSIS — N184 Chronic kidney disease, stage 4 (severe): Secondary | ICD-10-CM | POA: Diagnosis not present

## 2017-04-19 DIAGNOSIS — J439 Emphysema, unspecified: Secondary | ICD-10-CM | POA: Diagnosis not present

## 2017-04-19 DIAGNOSIS — I480 Paroxysmal atrial fibrillation: Secondary | ICD-10-CM | POA: Diagnosis not present

## 2017-04-19 DIAGNOSIS — M549 Dorsalgia, unspecified: Secondary | ICD-10-CM | POA: Diagnosis not present

## 2017-04-19 DIAGNOSIS — R7881 Bacteremia: Secondary | ICD-10-CM | POA: Diagnosis not present

## 2017-04-19 DIAGNOSIS — M546 Pain in thoracic spine: Secondary | ICD-10-CM | POA: Diagnosis not present

## 2017-04-19 DIAGNOSIS — N041 Nephrotic syndrome with focal and segmental glomerular lesions: Secondary | ICD-10-CM | POA: Diagnosis not present

## 2017-04-19 DIAGNOSIS — N179 Acute kidney failure, unspecified: Secondary | ICD-10-CM | POA: Diagnosis not present

## 2017-04-19 DIAGNOSIS — D649 Anemia, unspecified: Secondary | ICD-10-CM | POA: Diagnosis not present

## 2017-04-19 DIAGNOSIS — E039 Hypothyroidism, unspecified: Secondary | ICD-10-CM | POA: Diagnosis not present

## 2017-04-19 DIAGNOSIS — I2510 Atherosclerotic heart disease of native coronary artery without angina pectoris: Secondary | ICD-10-CM | POA: Diagnosis not present

## 2017-04-19 DIAGNOSIS — R509 Fever, unspecified: Secondary | ICD-10-CM | POA: Diagnosis not present

## 2017-04-19 DIAGNOSIS — Z8673 Personal history of transient ischemic attack (TIA), and cerebral infarction without residual deficits: Secondary | ICD-10-CM | POA: Diagnosis not present

## 2017-04-19 DIAGNOSIS — R0600 Dyspnea, unspecified: Secondary | ICD-10-CM | POA: Diagnosis not present

## 2017-04-19 DIAGNOSIS — R0902 Hypoxemia: Secondary | ICD-10-CM | POA: Diagnosis not present

## 2017-04-19 DIAGNOSIS — I5089 Other heart failure: Secondary | ICD-10-CM | POA: Diagnosis not present

## 2017-04-19 DIAGNOSIS — I130 Hypertensive heart and chronic kidney disease with heart failure and stage 1 through stage 4 chronic kidney disease, or unspecified chronic kidney disease: Secondary | ICD-10-CM | POA: Diagnosis not present

## 2017-04-19 DIAGNOSIS — N189 Chronic kidney disease, unspecified: Secondary | ICD-10-CM | POA: Diagnosis not present

## 2017-04-19 DIAGNOSIS — M109 Gout, unspecified: Secondary | ICD-10-CM | POA: Diagnosis not present

## 2017-04-19 DIAGNOSIS — E782 Mixed hyperlipidemia: Secondary | ICD-10-CM | POA: Diagnosis not present

## 2017-04-19 DIAGNOSIS — E1121 Type 2 diabetes mellitus with diabetic nephropathy: Secondary | ICD-10-CM | POA: Diagnosis not present

## 2017-04-19 DIAGNOSIS — B957 Other staphylococcus as the cause of diseases classified elsewhere: Secondary | ICD-10-CM | POA: Diagnosis not present

## 2017-04-19 DIAGNOSIS — J9621 Acute and chronic respiratory failure with hypoxia: Secondary | ICD-10-CM | POA: Diagnosis not present

## 2017-04-19 DIAGNOSIS — F0391 Unspecified dementia with behavioral disturbance: Secondary | ICD-10-CM | POA: Diagnosis not present

## 2017-04-19 DIAGNOSIS — I5023 Acute on chronic systolic (congestive) heart failure: Secondary | ICD-10-CM | POA: Diagnosis not present

## 2017-04-19 DIAGNOSIS — F05 Delirium due to known physiological condition: Secondary | ICD-10-CM | POA: Diagnosis not present

## 2017-04-19 DIAGNOSIS — J449 Chronic obstructive pulmonary disease, unspecified: Secondary | ICD-10-CM | POA: Diagnosis not present

## 2017-04-19 NOTE — Patient Outreach (Signed)
[facility]) Care Management  04/19/2017  Mario Small June 02, 1933 [id number]  Nurse Call Line Referral Date: 04/19/17 Reason for Referral: Shortness of breath. Oxygen equipment not working correctly. Insurance: [employer] Primary MD: Dr. Cher Nakai Outreach attempt #1  Telephone call to patient regarding nurse call line referral. Unable to reach patient. HIPAA compliant voice message left with call back phone number.   PLAN: RNCM will attempt 2nd telephone outreach to patient within 1 week.    Quinn Plowman RN,BSN,CCM [facility] Telephonic  [phone]

## 2017-04-20 ENCOUNTER — Other Ambulatory Visit: Payer: Self-pay

## 2017-04-20 DIAGNOSIS — R4182 Altered mental status, unspecified: Secondary | ICD-10-CM | POA: Diagnosis not present

## 2017-04-20 DIAGNOSIS — R9431 Abnormal electrocardiogram [ECG] [EKG]: Secondary | ICD-10-CM | POA: Diagnosis not present

## 2017-04-20 DIAGNOSIS — E039 Hypothyroidism, unspecified: Secondary | ICD-10-CM | POA: Diagnosis not present

## 2017-04-20 DIAGNOSIS — M109 Gout, unspecified: Secondary | ICD-10-CM | POA: Diagnosis not present

## 2017-04-20 DIAGNOSIS — N183 Chronic kidney disease, stage 3 (moderate): Secondary | ICD-10-CM | POA: Diagnosis not present

## 2017-04-20 DIAGNOSIS — F0390 Unspecified dementia without behavioral disturbance: Secondary | ICD-10-CM | POA: Diagnosis not present

## 2017-04-20 DIAGNOSIS — Z952 Presence of prosthetic heart valve: Secondary | ICD-10-CM | POA: Diagnosis not present

## 2017-04-20 DIAGNOSIS — A409 Streptococcal sepsis, unspecified: Secondary | ICD-10-CM | POA: Diagnosis not present

## 2017-04-20 DIAGNOSIS — G9340 Encephalopathy, unspecified: Secondary | ICD-10-CM | POA: Diagnosis not present

## 2017-04-20 DIAGNOSIS — I509 Heart failure, unspecified: Secondary | ICD-10-CM | POA: Diagnosis not present

## 2017-04-20 DIAGNOSIS — M546 Pain in thoracic spine: Secondary | ICD-10-CM | POA: Diagnosis not present

## 2017-04-20 DIAGNOSIS — E782 Mixed hyperlipidemia: Secondary | ICD-10-CM | POA: Diagnosis not present

## 2017-04-20 DIAGNOSIS — I130 Hypertensive heart and chronic kidney disease with heart failure and stage 1 through stage 4 chronic kidney disease, or unspecified chronic kidney disease: Secondary | ICD-10-CM | POA: Diagnosis not present

## 2017-04-20 DIAGNOSIS — R7881 Bacteremia: Secondary | ICD-10-CM | POA: Diagnosis not present

## 2017-04-20 DIAGNOSIS — I480 Paroxysmal atrial fibrillation: Secondary | ICD-10-CM | POA: Diagnosis not present

## 2017-04-20 NOTE — Patient Outreach (Signed)
[facility]) Care Management  04/20/2017  Mario Small June 09, 1933 [id number]   Nurse Call Line Referral Date: 04/19/17 Reason for Referral: Shortness of breath. Oxygen equipment not working correctly. Insurance: [employer] Primary MD: Dr. Cher Nakai Outreach attempt #2  Telephone call to patient regarding nurse call line referral. Unable to reach patient. HIPAA compliant voice message left with call back phone number.   PLAN: RNCM will attempt 3rd telephone outreach to patient within 1 week.    Quinn Plowman RN,BSN,CCM [facility] Telephonic  [phone]

## 2017-04-21 ENCOUNTER — Other Ambulatory Visit: Payer: Self-pay

## 2017-04-21 DIAGNOSIS — I447 Left bundle-branch block, unspecified: Secondary | ICD-10-CM | POA: Diagnosis not present

## 2017-04-21 DIAGNOSIS — I350 Nonrheumatic aortic (valve) stenosis: Secondary | ICD-10-CM | POA: Diagnosis not present

## 2017-04-21 DIAGNOSIS — G9340 Encephalopathy, unspecified: Secondary | ICD-10-CM | POA: Diagnosis not present

## 2017-04-21 DIAGNOSIS — N184 Chronic kidney disease, stage 4 (severe): Secondary | ICD-10-CM | POA: Diagnosis not present

## 2017-04-21 DIAGNOSIS — R0602 Shortness of breath: Secondary | ICD-10-CM | POA: Diagnosis not present

## 2017-04-21 DIAGNOSIS — I252 Old myocardial infarction: Secondary | ICD-10-CM | POA: Diagnosis not present

## 2017-04-21 DIAGNOSIS — N170 Acute kidney failure with tubular necrosis: Secondary | ICD-10-CM | POA: Diagnosis not present

## 2017-04-21 DIAGNOSIS — Z952 Presence of prosthetic heart valve: Secondary | ICD-10-CM | POA: Diagnosis not present

## 2017-04-21 DIAGNOSIS — A491 Streptococcal infection, unspecified site: Secondary | ICD-10-CM | POA: Diagnosis not present

## 2017-04-21 DIAGNOSIS — N183 Chronic kidney disease, stage 3 (moderate): Secondary | ICD-10-CM | POA: Diagnosis not present

## 2017-04-21 DIAGNOSIS — R6520 Severe sepsis without septic shock: Secondary | ICD-10-CM | POA: Diagnosis not present

## 2017-04-21 DIAGNOSIS — I480 Paroxysmal atrial fibrillation: Secondary | ICD-10-CM | POA: Diagnosis not present

## 2017-04-21 DIAGNOSIS — A409 Streptococcal sepsis, unspecified: Secondary | ICD-10-CM | POA: Diagnosis not present

## 2017-04-21 DIAGNOSIS — F17211 Nicotine dependence, cigarettes, in remission: Secondary | ICD-10-CM | POA: Diagnosis not present

## 2017-04-21 DIAGNOSIS — R7881 Bacteremia: Secondary | ICD-10-CM | POA: Diagnosis not present

## 2017-04-21 NOTE — Patient Outreach (Signed)
[place] [facility]) Care Management  04/21/2017  [name] [birth year] [id number]  Nurse Call Line Referral Date: 04/19/17 Reason for Referral: Shortness of breath. Oxygen equipment not working correctly. Insurance: [employer] Primary MD: Dr. Cher Nakai Outreach attempt #3  Telephone call to patient regarding nurse call line referral. Unable to reach patient. HIPAA compliant voice message left with call back phone number.   PLAN: RNCM will send patient outreach letter to attempt contact  Quinn Plowman RN,BSN,CCM [facility] Telephonic  [phone]

## 2017-04-22 DIAGNOSIS — G9340 Encephalopathy, unspecified: Secondary | ICD-10-CM | POA: Diagnosis not present

## 2017-04-22 DIAGNOSIS — R188 Other ascites: Secondary | ICD-10-CM | POA: Diagnosis not present

## 2017-04-22 DIAGNOSIS — M545 Low back pain: Secondary | ICD-10-CM | POA: Diagnosis not present

## 2017-04-22 DIAGNOSIS — N170 Acute kidney failure with tubular necrosis: Secondary | ICD-10-CM | POA: Diagnosis not present

## 2017-04-22 DIAGNOSIS — A491 Streptococcal infection, unspecified site: Secondary | ICD-10-CM | POA: Diagnosis not present

## 2017-04-22 DIAGNOSIS — Z952 Presence of prosthetic heart valve: Secondary | ICD-10-CM | POA: Diagnosis not present

## 2017-04-22 DIAGNOSIS — I5023 Acute on chronic systolic (congestive) heart failure: Secondary | ICD-10-CM | POA: Diagnosis not present

## 2017-04-22 DIAGNOSIS — A409 Streptococcal sepsis, unspecified: Secondary | ICD-10-CM | POA: Diagnosis not present

## 2017-04-22 DIAGNOSIS — R6520 Severe sepsis without septic shock: Secondary | ICD-10-CM | POA: Diagnosis not present

## 2017-04-22 DIAGNOSIS — R7881 Bacteremia: Secondary | ICD-10-CM | POA: Diagnosis not present

## 2017-04-22 DIAGNOSIS — N184 Chronic kidney disease, stage 4 (severe): Secondary | ICD-10-CM | POA: Diagnosis not present

## 2017-04-23 DIAGNOSIS — E1122 Type 2 diabetes mellitus with diabetic chronic kidney disease: Secondary | ICD-10-CM | POA: Diagnosis not present

## 2017-04-23 DIAGNOSIS — A491 Streptococcal infection, unspecified site: Secondary | ICD-10-CM | POA: Diagnosis not present

## 2017-04-23 DIAGNOSIS — N170 Acute kidney failure with tubular necrosis: Secondary | ICD-10-CM | POA: Diagnosis not present

## 2017-04-23 DIAGNOSIS — N184 Chronic kidney disease, stage 4 (severe): Secondary | ICD-10-CM | POA: Diagnosis not present

## 2017-04-23 DIAGNOSIS — I130 Hypertensive heart and chronic kidney disease with heart failure and stage 1 through stage 4 chronic kidney disease, or unspecified chronic kidney disease: Secondary | ICD-10-CM | POA: Diagnosis not present

## 2017-04-23 DIAGNOSIS — A409 Streptococcal sepsis, unspecified: Secondary | ICD-10-CM | POA: Diagnosis not present

## 2017-04-23 DIAGNOSIS — E119 Type 2 diabetes mellitus without complications: Secondary | ICD-10-CM | POA: Diagnosis not present

## 2017-04-23 DIAGNOSIS — R6520 Severe sepsis without septic shock: Secondary | ICD-10-CM | POA: Diagnosis not present

## 2017-04-23 DIAGNOSIS — N189 Chronic kidney disease, unspecified: Secondary | ICD-10-CM | POA: Diagnosis not present

## 2017-04-23 DIAGNOSIS — Z952 Presence of prosthetic heart valve: Secondary | ICD-10-CM | POA: Diagnosis not present

## 2017-04-23 DIAGNOSIS — G9340 Encephalopathy, unspecified: Secondary | ICD-10-CM | POA: Diagnosis not present

## 2017-04-23 DIAGNOSIS — I5023 Acute on chronic systolic (congestive) heart failure: Secondary | ICD-10-CM | POA: Diagnosis not present

## 2017-04-23 DIAGNOSIS — I062 Rheumatic aortic stenosis with insufficiency: Secondary | ICD-10-CM | POA: Diagnosis not present

## 2017-04-23 DIAGNOSIS — R7881 Bacteremia: Secondary | ICD-10-CM | POA: Diagnosis not present

## 2017-04-24 DIAGNOSIS — N183 Chronic kidney disease, stage 3 (moderate): Secondary | ICD-10-CM | POA: Diagnosis not present

## 2017-04-24 DIAGNOSIS — N184 Chronic kidney disease, stage 4 (severe): Secondary | ICD-10-CM | POA: Diagnosis not present

## 2017-04-24 DIAGNOSIS — R7881 Bacteremia: Secondary | ICD-10-CM | POA: Diagnosis not present

## 2017-04-24 DIAGNOSIS — I509 Heart failure, unspecified: Secondary | ICD-10-CM | POA: Diagnosis not present

## 2017-04-24 DIAGNOSIS — R6520 Severe sepsis without septic shock: Secondary | ICD-10-CM | POA: Diagnosis not present

## 2017-04-24 DIAGNOSIS — E782 Mixed hyperlipidemia: Secondary | ICD-10-CM | POA: Diagnosis not present

## 2017-04-24 DIAGNOSIS — I10 Essential (primary) hypertension: Secondary | ICD-10-CM | POA: Diagnosis not present

## 2017-04-24 DIAGNOSIS — M109 Gout, unspecified: Secondary | ICD-10-CM | POA: Diagnosis not present

## 2017-04-24 DIAGNOSIS — A491 Streptococcal infection, unspecified site: Secondary | ICD-10-CM | POA: Diagnosis not present

## 2017-04-24 DIAGNOSIS — I480 Paroxysmal atrial fibrillation: Secondary | ICD-10-CM | POA: Diagnosis not present

## 2017-04-24 DIAGNOSIS — N170 Acute kidney failure with tubular necrosis: Secondary | ICD-10-CM | POA: Diagnosis not present

## 2017-04-24 DIAGNOSIS — J449 Chronic obstructive pulmonary disease, unspecified: Secondary | ICD-10-CM | POA: Diagnosis not present

## 2017-04-24 DIAGNOSIS — E039 Hypothyroidism, unspecified: Secondary | ICD-10-CM | POA: Diagnosis not present

## 2017-04-25 DIAGNOSIS — D649 Anemia, unspecified: Secondary | ICD-10-CM | POA: Diagnosis not present

## 2017-04-25 DIAGNOSIS — Z452 Encounter for adjustment and management of vascular access device: Secondary | ICD-10-CM | POA: Diagnosis not present

## 2017-04-25 DIAGNOSIS — M109 Gout, unspecified: Secondary | ICD-10-CM | POA: Diagnosis not present

## 2017-04-25 DIAGNOSIS — B954 Other streptococcus as the cause of diseases classified elsewhere: Secondary | ICD-10-CM | POA: Diagnosis not present

## 2017-04-25 DIAGNOSIS — R410 Disorientation, unspecified: Secondary | ICD-10-CM | POA: Diagnosis not present

## 2017-04-25 DIAGNOSIS — N184 Chronic kidney disease, stage 4 (severe): Secondary | ICD-10-CM | POA: Diagnosis not present

## 2017-04-25 DIAGNOSIS — I5023 Acute on chronic systolic (congestive) heart failure: Secondary | ICD-10-CM | POA: Diagnosis not present

## 2017-04-25 DIAGNOSIS — A409 Streptococcal sepsis, unspecified: Secondary | ICD-10-CM | POA: Diagnosis not present

## 2017-04-25 DIAGNOSIS — N183 Chronic kidney disease, stage 3 (moderate): Secondary | ICD-10-CM | POA: Diagnosis not present

## 2017-04-25 DIAGNOSIS — I480 Paroxysmal atrial fibrillation: Secondary | ICD-10-CM | POA: Diagnosis not present

## 2017-04-25 DIAGNOSIS — R7881 Bacteremia: Secondary | ICD-10-CM | POA: Diagnosis not present

## 2017-04-25 DIAGNOSIS — A491 Streptococcal infection, unspecified site: Secondary | ICD-10-CM | POA: Diagnosis not present

## 2017-04-25 DIAGNOSIS — Z952 Presence of prosthetic heart valve: Secondary | ICD-10-CM | POA: Diagnosis not present

## 2017-04-25 DIAGNOSIS — J449 Chronic obstructive pulmonary disease, unspecified: Secondary | ICD-10-CM | POA: Diagnosis not present

## 2017-04-25 DIAGNOSIS — E782 Mixed hyperlipidemia: Secondary | ICD-10-CM | POA: Diagnosis not present

## 2017-04-25 DIAGNOSIS — I509 Heart failure, unspecified: Secondary | ICD-10-CM | POA: Diagnosis not present

## 2017-04-25 DIAGNOSIS — I10 Essential (primary) hypertension: Secondary | ICD-10-CM | POA: Diagnosis not present

## 2017-04-25 DIAGNOSIS — E039 Hypothyroidism, unspecified: Secondary | ICD-10-CM | POA: Diagnosis not present

## 2017-04-26 DIAGNOSIS — N184 Chronic kidney disease, stage 4 (severe): Secondary | ICD-10-CM | POA: Diagnosis not present

## 2017-04-26 DIAGNOSIS — R410 Disorientation, unspecified: Secondary | ICD-10-CM | POA: Diagnosis not present

## 2017-04-26 DIAGNOSIS — R7881 Bacteremia: Secondary | ICD-10-CM | POA: Diagnosis not present

## 2017-04-26 DIAGNOSIS — R4182 Altered mental status, unspecified: Secondary | ICD-10-CM | POA: Diagnosis not present

## 2017-04-26 DIAGNOSIS — I509 Heart failure, unspecified: Secondary | ICD-10-CM | POA: Diagnosis not present

## 2017-04-26 DIAGNOSIS — M109 Gout, unspecified: Secondary | ICD-10-CM | POA: Diagnosis not present

## 2017-04-26 DIAGNOSIS — I480 Paroxysmal atrial fibrillation: Secondary | ICD-10-CM | POA: Diagnosis not present

## 2017-04-26 DIAGNOSIS — R443 Hallucinations, unspecified: Secondary | ICD-10-CM | POA: Diagnosis not present

## 2017-04-26 DIAGNOSIS — E039 Hypothyroidism, unspecified: Secondary | ICD-10-CM | POA: Diagnosis not present

## 2017-04-26 DIAGNOSIS — A409 Streptococcal sepsis, unspecified: Secondary | ICD-10-CM | POA: Diagnosis not present

## 2017-04-26 DIAGNOSIS — I10 Essential (primary) hypertension: Secondary | ICD-10-CM | POA: Diagnosis not present

## 2017-04-26 DIAGNOSIS — E782 Mixed hyperlipidemia: Secondary | ICD-10-CM | POA: Diagnosis not present

## 2017-04-26 DIAGNOSIS — Z952 Presence of prosthetic heart valve: Secondary | ICD-10-CM | POA: Diagnosis not present

## 2017-04-26 DIAGNOSIS — T83490A Other mechanical complication of penile (implanted) prosthesis, initial encounter: Secondary | ICD-10-CM | POA: Diagnosis not present

## 2017-04-26 DIAGNOSIS — A491 Streptococcal infection, unspecified site: Secondary | ICD-10-CM | POA: Diagnosis not present

## 2017-04-26 DIAGNOSIS — D649 Anemia, unspecified: Secondary | ICD-10-CM | POA: Diagnosis not present

## 2017-04-26 DIAGNOSIS — J449 Chronic obstructive pulmonary disease, unspecified: Secondary | ICD-10-CM | POA: Diagnosis not present

## 2017-04-26 DIAGNOSIS — N183 Chronic kidney disease, stage 3 (moderate): Secondary | ICD-10-CM | POA: Diagnosis not present

## 2017-04-27 DIAGNOSIS — R7881 Bacteremia: Secondary | ICD-10-CM | POA: Diagnosis not present

## 2017-04-27 DIAGNOSIS — N184 Chronic kidney disease, stage 4 (severe): Secondary | ICD-10-CM | POA: Diagnosis not present

## 2017-04-27 DIAGNOSIS — I509 Heart failure, unspecified: Secondary | ICD-10-CM | POA: Diagnosis not present

## 2017-04-27 DIAGNOSIS — A409 Streptococcal sepsis, unspecified: Secondary | ICD-10-CM | POA: Diagnosis not present

## 2017-04-27 DIAGNOSIS — I081 Rheumatic disorders of both mitral and tricuspid valves: Secondary | ICD-10-CM | POA: Diagnosis not present

## 2017-04-27 DIAGNOSIS — A491 Streptococcal infection, unspecified site: Secondary | ICD-10-CM | POA: Diagnosis not present

## 2017-04-27 DIAGNOSIS — J449 Chronic obstructive pulmonary disease, unspecified: Secondary | ICD-10-CM | POA: Diagnosis not present

## 2017-04-27 DIAGNOSIS — I10 Essential (primary) hypertension: Secondary | ICD-10-CM | POA: Diagnosis not present

## 2017-04-27 DIAGNOSIS — M109 Gout, unspecified: Secondary | ICD-10-CM | POA: Diagnosis not present

## 2017-04-27 DIAGNOSIS — D649 Anemia, unspecified: Secondary | ICD-10-CM | POA: Diagnosis not present

## 2017-04-27 DIAGNOSIS — Z952 Presence of prosthetic heart valve: Secondary | ICD-10-CM | POA: Diagnosis not present

## 2017-04-27 DIAGNOSIS — I480 Paroxysmal atrial fibrillation: Secondary | ICD-10-CM | POA: Diagnosis not present

## 2017-04-27 DIAGNOSIS — N183 Chronic kidney disease, stage 3 (moderate): Secondary | ICD-10-CM | POA: Diagnosis not present

## 2017-04-27 DIAGNOSIS — E782 Mixed hyperlipidemia: Secondary | ICD-10-CM | POA: Diagnosis not present

## 2017-04-27 DIAGNOSIS — E039 Hypothyroidism, unspecified: Secondary | ICD-10-CM | POA: Diagnosis not present

## 2017-04-27 DIAGNOSIS — I5023 Acute on chronic systolic (congestive) heart failure: Secondary | ICD-10-CM | POA: Diagnosis not present

## 2017-04-28 DIAGNOSIS — N189 Chronic kidney disease, unspecified: Secondary | ICD-10-CM | POA: Diagnosis not present

## 2017-04-28 DIAGNOSIS — R7881 Bacteremia: Secondary | ICD-10-CM | POA: Diagnosis not present

## 2017-04-28 DIAGNOSIS — N183 Chronic kidney disease, stage 3 (moderate): Secondary | ICD-10-CM | POA: Diagnosis not present

## 2017-04-28 DIAGNOSIS — A491 Streptococcal infection, unspecified site: Secondary | ICD-10-CM | POA: Diagnosis not present

## 2017-04-28 DIAGNOSIS — D649 Anemia, unspecified: Secondary | ICD-10-CM | POA: Diagnosis not present

## 2017-04-28 DIAGNOSIS — E119 Type 2 diabetes mellitus without complications: Secondary | ICD-10-CM | POA: Diagnosis not present

## 2017-04-28 DIAGNOSIS — N184 Chronic kidney disease, stage 4 (severe): Secondary | ICD-10-CM | POA: Diagnosis not present

## 2017-04-28 DIAGNOSIS — E039 Hypothyroidism, unspecified: Secondary | ICD-10-CM | POA: Diagnosis not present

## 2017-04-28 DIAGNOSIS — Z952 Presence of prosthetic heart valve: Secondary | ICD-10-CM | POA: Diagnosis not present

## 2017-04-28 DIAGNOSIS — I10 Essential (primary) hypertension: Secondary | ICD-10-CM | POA: Diagnosis not present

## 2017-04-28 DIAGNOSIS — A409 Streptococcal sepsis, unspecified: Secondary | ICD-10-CM | POA: Diagnosis not present

## 2017-04-28 DIAGNOSIS — E782 Mixed hyperlipidemia: Secondary | ICD-10-CM | POA: Diagnosis not present

## 2017-04-28 DIAGNOSIS — J449 Chronic obstructive pulmonary disease, unspecified: Secondary | ICD-10-CM | POA: Diagnosis not present

## 2017-04-28 DIAGNOSIS — I509 Heart failure, unspecified: Secondary | ICD-10-CM | POA: Diagnosis not present

## 2017-04-28 DIAGNOSIS — M109 Gout, unspecified: Secondary | ICD-10-CM | POA: Diagnosis not present

## 2017-04-28 DIAGNOSIS — I350 Nonrheumatic aortic (valve) stenosis: Secondary | ICD-10-CM | POA: Diagnosis not present

## 2017-04-28 DIAGNOSIS — I480 Paroxysmal atrial fibrillation: Secondary | ICD-10-CM | POA: Diagnosis not present

## 2017-04-29 DIAGNOSIS — M109 Gout, unspecified: Secondary | ICD-10-CM | POA: Diagnosis not present

## 2017-04-29 DIAGNOSIS — I509 Heart failure, unspecified: Secondary | ICD-10-CM | POA: Diagnosis not present

## 2017-04-29 DIAGNOSIS — E039 Hypothyroidism, unspecified: Secondary | ICD-10-CM | POA: Diagnosis not present

## 2017-04-29 DIAGNOSIS — S3994XA Unspecified injury of external genitals, initial encounter: Secondary | ICD-10-CM | POA: Diagnosis not present

## 2017-04-29 DIAGNOSIS — I480 Paroxysmal atrial fibrillation: Secondary | ICD-10-CM | POA: Diagnosis not present

## 2017-04-29 DIAGNOSIS — M6250 Muscle wasting and atrophy, not elsewhere classified, unspecified site: Secondary | ICD-10-CM | POA: Diagnosis not present

## 2017-04-29 DIAGNOSIS — N184 Chronic kidney disease, stage 4 (severe): Secondary | ICD-10-CM | POA: Diagnosis not present

## 2017-04-29 DIAGNOSIS — B999 Unspecified infectious disease: Secondary | ICD-10-CM | POA: Diagnosis not present

## 2017-04-29 DIAGNOSIS — R7881 Bacteremia: Secondary | ICD-10-CM | POA: Diagnosis not present

## 2017-04-29 DIAGNOSIS — I130 Hypertensive heart and chronic kidney disease with heart failure and stage 1 through stage 4 chronic kidney disease, or unspecified chronic kidney disease: Secondary | ICD-10-CM | POA: Diagnosis not present

## 2017-04-29 DIAGNOSIS — R4182 Altered mental status, unspecified: Secondary | ICD-10-CM | POA: Diagnosis not present

## 2017-04-29 DIAGNOSIS — A491 Streptococcal infection, unspecified site: Secondary | ICD-10-CM | POA: Diagnosis not present

## 2017-04-29 DIAGNOSIS — J449 Chronic obstructive pulmonary disease, unspecified: Secondary | ICD-10-CM | POA: Diagnosis not present

## 2017-04-29 DIAGNOSIS — Z952 Presence of prosthetic heart valve: Secondary | ICD-10-CM | POA: Diagnosis not present

## 2017-04-29 DIAGNOSIS — A409 Streptococcal sepsis, unspecified: Secondary | ICD-10-CM | POA: Diagnosis not present

## 2017-04-29 DIAGNOSIS — E782 Mixed hyperlipidemia: Secondary | ICD-10-CM | POA: Diagnosis not present

## 2017-04-29 DIAGNOSIS — N183 Chronic kidney disease, stage 3 (moderate): Secondary | ICD-10-CM | POA: Diagnosis not present

## 2017-04-29 DIAGNOSIS — I10 Essential (primary) hypertension: Secondary | ICD-10-CM | POA: Diagnosis not present

## 2017-05-01 DIAGNOSIS — Z87891 Personal history of nicotine dependence: Secondary | ICD-10-CM | POA: Diagnosis not present

## 2017-05-01 DIAGNOSIS — G9340 Encephalopathy, unspecified: Secondary | ICD-10-CM | POA: Diagnosis not present

## 2017-05-01 DIAGNOSIS — F0390 Unspecified dementia without behavioral disturbance: Secondary | ICD-10-CM | POA: Diagnosis not present

## 2017-05-01 DIAGNOSIS — E782 Mixed hyperlipidemia: Secondary | ICD-10-CM | POA: Diagnosis not present

## 2017-05-01 DIAGNOSIS — Z7409 Other reduced mobility: Secondary | ICD-10-CM | POA: Diagnosis not present

## 2017-05-01 DIAGNOSIS — I350 Nonrheumatic aortic (valve) stenosis: Secondary | ICD-10-CM | POA: Diagnosis not present

## 2017-05-01 DIAGNOSIS — J810 Acute pulmonary edema: Secondary | ICD-10-CM | POA: Diagnosis not present

## 2017-05-01 DIAGNOSIS — R079 Chest pain, unspecified: Secondary | ICD-10-CM | POA: Diagnosis not present

## 2017-05-01 DIAGNOSIS — I480 Paroxysmal atrial fibrillation: Secondary | ICD-10-CM | POA: Diagnosis not present

## 2017-05-01 DIAGNOSIS — I252 Old myocardial infarction: Secondary | ICD-10-CM | POA: Diagnosis not present

## 2017-05-01 DIAGNOSIS — I2510 Atherosclerotic heart disease of native coronary artery without angina pectoris: Secondary | ICD-10-CM | POA: Diagnosis not present

## 2017-05-01 DIAGNOSIS — I509 Heart failure, unspecified: Secondary | ICD-10-CM | POA: Diagnosis not present

## 2017-05-01 DIAGNOSIS — E1122 Type 2 diabetes mellitus with diabetic chronic kidney disease: Secondary | ICD-10-CM | POA: Diagnosis not present

## 2017-05-01 DIAGNOSIS — G3184 Mild cognitive impairment, so stated: Secondary | ICD-10-CM | POA: Diagnosis not present

## 2017-05-01 DIAGNOSIS — N183 Chronic kidney disease, stage 3 (moderate): Secondary | ICD-10-CM | POA: Diagnosis not present

## 2017-05-01 DIAGNOSIS — D649 Anemia, unspecified: Secondary | ICD-10-CM | POA: Diagnosis not present

## 2017-05-01 DIAGNOSIS — M549 Dorsalgia, unspecified: Secondary | ICD-10-CM | POA: Diagnosis not present

## 2017-05-01 DIAGNOSIS — I130 Hypertensive heart and chronic kidney disease with heart failure and stage 1 through stage 4 chronic kidney disease, or unspecified chronic kidney disease: Secondary | ICD-10-CM | POA: Diagnosis not present

## 2017-05-01 DIAGNOSIS — R5383 Other fatigue: Secondary | ICD-10-CM | POA: Diagnosis not present

## 2017-05-01 DIAGNOSIS — R0789 Other chest pain: Secondary | ICD-10-CM | POA: Diagnosis not present

## 2017-05-01 DIAGNOSIS — F419 Anxiety disorder, unspecified: Secondary | ICD-10-CM | POA: Diagnosis not present

## 2017-05-01 DIAGNOSIS — I5022 Chronic systolic (congestive) heart failure: Secondary | ICD-10-CM | POA: Diagnosis not present

## 2017-05-01 DIAGNOSIS — E039 Hypothyroidism, unspecified: Secondary | ICD-10-CM | POA: Diagnosis not present

## 2017-05-01 DIAGNOSIS — I447 Left bundle-branch block, unspecified: Secondary | ICD-10-CM | POA: Diagnosis not present

## 2017-05-01 DIAGNOSIS — I38 Endocarditis, valve unspecified: Secondary | ICD-10-CM | POA: Diagnosis not present

## 2017-05-01 DIAGNOSIS — J9611 Chronic respiratory failure with hypoxia: Secondary | ICD-10-CM | POA: Diagnosis not present

## 2017-05-01 DIAGNOSIS — I110 Hypertensive heart disease with heart failure: Secondary | ICD-10-CM | POA: Diagnosis not present

## 2017-05-01 DIAGNOSIS — Z952 Presence of prosthetic heart valve: Secondary | ICD-10-CM | POA: Diagnosis not present

## 2017-05-01 DIAGNOSIS — A491 Streptococcal infection, unspecified site: Secondary | ICD-10-CM | POA: Diagnosis not present

## 2017-05-01 DIAGNOSIS — F17211 Nicotine dependence, cigarettes, in remission: Secondary | ICD-10-CM | POA: Diagnosis not present

## 2017-05-01 DIAGNOSIS — D631 Anemia in chronic kidney disease: Secondary | ICD-10-CM | POA: Diagnosis not present

## 2017-05-01 DIAGNOSIS — I5023 Acute on chronic systolic (congestive) heart failure: Secondary | ICD-10-CM | POA: Diagnosis not present

## 2017-05-01 DIAGNOSIS — Z955 Presence of coronary angioplasty implant and graft: Secondary | ICD-10-CM | POA: Diagnosis not present

## 2017-05-02 DIAGNOSIS — E119 Type 2 diabetes mellitus without complications: Secondary | ICD-10-CM | POA: Diagnosis not present

## 2017-05-02 DIAGNOSIS — I447 Left bundle-branch block, unspecified: Secondary | ICD-10-CM | POA: Diagnosis not present

## 2017-05-02 DIAGNOSIS — I2510 Atherosclerotic heart disease of native coronary artery without angina pectoris: Secondary | ICD-10-CM | POA: Diagnosis not present

## 2017-05-02 DIAGNOSIS — N189 Chronic kidney disease, unspecified: Secondary | ICD-10-CM | POA: Diagnosis not present

## 2017-05-02 DIAGNOSIS — I350 Nonrheumatic aortic (valve) stenosis: Secondary | ICD-10-CM | POA: Diagnosis not present

## 2017-05-02 DIAGNOSIS — G9340 Encephalopathy, unspecified: Secondary | ICD-10-CM | POA: Diagnosis not present

## 2017-05-02 DIAGNOSIS — R079 Chest pain, unspecified: Secondary | ICD-10-CM | POA: Diagnosis not present

## 2017-05-03 DIAGNOSIS — Z955 Presence of coronary angioplasty implant and graft: Secondary | ICD-10-CM | POA: Diagnosis not present

## 2017-05-03 DIAGNOSIS — Z7984 Long term (current) use of oral hypoglycemic drugs: Secondary | ICD-10-CM | POA: Diagnosis not present

## 2017-05-03 DIAGNOSIS — Z9981 Dependence on supplemental oxygen: Secondary | ICD-10-CM | POA: Diagnosis not present

## 2017-05-03 DIAGNOSIS — E785 Hyperlipidemia, unspecified: Secondary | ICD-10-CM | POA: Diagnosis not present

## 2017-05-03 DIAGNOSIS — I5020 Unspecified systolic (congestive) heart failure: Secondary | ICD-10-CM | POA: Diagnosis not present

## 2017-05-03 DIAGNOSIS — Z952 Presence of prosthetic heart valve: Secondary | ICD-10-CM | POA: Diagnosis not present

## 2017-05-03 DIAGNOSIS — Z792 Long term (current) use of antibiotics: Secondary | ICD-10-CM | POA: Diagnosis not present

## 2017-05-03 DIAGNOSIS — I2510 Atherosclerotic heart disease of native coronary artery without angina pectoris: Secondary | ICD-10-CM | POA: Diagnosis not present

## 2017-05-03 DIAGNOSIS — Z452 Encounter for adjustment and management of vascular access device: Secondary | ICD-10-CM | POA: Diagnosis not present

## 2017-05-03 DIAGNOSIS — E1122 Type 2 diabetes mellitus with diabetic chronic kidney disease: Secondary | ICD-10-CM | POA: Diagnosis not present

## 2017-05-03 DIAGNOSIS — J449 Chronic obstructive pulmonary disease, unspecified: Secondary | ICD-10-CM | POA: Diagnosis not present

## 2017-05-03 DIAGNOSIS — I130 Hypertensive heart and chronic kidney disease with heart failure and stage 1 through stage 4 chronic kidney disease, or unspecified chronic kidney disease: Secondary | ICD-10-CM | POA: Diagnosis not present

## 2017-05-03 DIAGNOSIS — D631 Anemia in chronic kidney disease: Secondary | ICD-10-CM | POA: Diagnosis not present

## 2017-05-03 DIAGNOSIS — A419 Sepsis, unspecified organism: Secondary | ICD-10-CM | POA: Diagnosis not present

## 2017-05-03 DIAGNOSIS — Z7982 Long term (current) use of aspirin: Secondary | ICD-10-CM | POA: Diagnosis not present

## 2017-05-03 DIAGNOSIS — Z8673 Personal history of transient ischemic attack (TIA), and cerebral infarction without residual deficits: Secondary | ICD-10-CM | POA: Diagnosis not present

## 2017-05-03 DIAGNOSIS — F419 Anxiety disorder, unspecified: Secondary | ICD-10-CM | POA: Diagnosis not present

## 2017-05-03 DIAGNOSIS — I480 Paroxysmal atrial fibrillation: Secondary | ICD-10-CM | POA: Diagnosis not present

## 2017-05-03 DIAGNOSIS — F0391 Unspecified dementia with behavioral disturbance: Secondary | ICD-10-CM | POA: Diagnosis not present

## 2017-05-03 DIAGNOSIS — M109 Gout, unspecified: Secondary | ICD-10-CM | POA: Diagnosis not present

## 2017-05-03 DIAGNOSIS — N183 Chronic kidney disease, stage 3 (moderate): Secondary | ICD-10-CM | POA: Diagnosis not present

## 2017-05-03 DIAGNOSIS — J9611 Chronic respiratory failure with hypoxia: Secondary | ICD-10-CM | POA: Diagnosis not present

## 2017-05-05 ENCOUNTER — Other Ambulatory Visit: Payer: Self-pay

## 2017-05-05 NOTE — Patient Outreach (Signed)
[facility]) Care Management  05/05/2017  [name] [birth year] [id number]   No response from patient after 3 telephone calls and letter outreach.   Plan:  RNCM will refer patient to care management assistant to close due to being unable to reach patient.  RNCM will notify patients primary MD of closure.   Quinn Plowman RN,BSN,CCM [facility] Telephonic  [phone]

## 2017-05-06 DIAGNOSIS — M9905 Segmental and somatic dysfunction of pelvic region: Secondary | ICD-10-CM | POA: Diagnosis not present

## 2017-05-06 DIAGNOSIS — R319 Hematuria, unspecified: Secondary | ICD-10-CM | POA: Diagnosis not present

## 2017-05-06 DIAGNOSIS — M5414 Radiculopathy, thoracic region: Secondary | ICD-10-CM | POA: Diagnosis not present

## 2017-05-06 DIAGNOSIS — M9903 Segmental and somatic dysfunction of lumbar region: Secondary | ICD-10-CM | POA: Diagnosis not present

## 2017-05-06 DIAGNOSIS — M9902 Segmental and somatic dysfunction of thoracic region: Secondary | ICD-10-CM | POA: Diagnosis not present

## 2017-05-09 DIAGNOSIS — I130 Hypertensive heart and chronic kidney disease with heart failure and stage 1 through stage 4 chronic kidney disease, or unspecified chronic kidney disease: Secondary | ICD-10-CM | POA: Diagnosis not present

## 2017-05-09 DIAGNOSIS — M9903 Segmental and somatic dysfunction of lumbar region: Secondary | ICD-10-CM | POA: Diagnosis not present

## 2017-05-09 DIAGNOSIS — D631 Anemia in chronic kidney disease: Secondary | ICD-10-CM | POA: Diagnosis not present

## 2017-05-09 DIAGNOSIS — M9905 Segmental and somatic dysfunction of pelvic region: Secondary | ICD-10-CM | POA: Diagnosis not present

## 2017-05-09 DIAGNOSIS — A419 Sepsis, unspecified organism: Secondary | ICD-10-CM | POA: Diagnosis not present

## 2017-05-09 DIAGNOSIS — M9902 Segmental and somatic dysfunction of thoracic region: Secondary | ICD-10-CM | POA: Diagnosis not present

## 2017-05-09 DIAGNOSIS — E1122 Type 2 diabetes mellitus with diabetic chronic kidney disease: Secondary | ICD-10-CM | POA: Diagnosis not present

## 2017-05-09 DIAGNOSIS — M5414 Radiculopathy, thoracic region: Secondary | ICD-10-CM | POA: Diagnosis not present

## 2017-05-09 DIAGNOSIS — Z792 Long term (current) use of antibiotics: Secondary | ICD-10-CM | POA: Diagnosis not present

## 2017-05-10 DIAGNOSIS — E039 Hypothyroidism, unspecified: Secondary | ICD-10-CM | POA: Diagnosis not present

## 2017-05-10 DIAGNOSIS — E611 Iron deficiency: Secondary | ICD-10-CM | POA: Diagnosis not present

## 2017-05-10 DIAGNOSIS — N189 Chronic kidney disease, unspecified: Secondary | ICD-10-CM | POA: Diagnosis not present

## 2017-05-10 DIAGNOSIS — I5042 Chronic combined systolic (congestive) and diastolic (congestive) heart failure: Secondary | ICD-10-CM | POA: Diagnosis not present

## 2017-05-10 DIAGNOSIS — I330 Acute and subacute infective endocarditis: Secondary | ICD-10-CM | POA: Diagnosis not present

## 2017-05-10 DIAGNOSIS — R410 Disorientation, unspecified: Secondary | ICD-10-CM | POA: Diagnosis not present

## 2017-05-10 DIAGNOSIS — I129 Hypertensive chronic kidney disease with stage 1 through stage 4 chronic kidney disease, or unspecified chronic kidney disease: Secondary | ICD-10-CM | POA: Diagnosis not present

## 2017-05-10 DIAGNOSIS — M109 Gout, unspecified: Secondary | ICD-10-CM | POA: Diagnosis not present

## 2017-05-10 DIAGNOSIS — E785 Hyperlipidemia, unspecified: Secondary | ICD-10-CM | POA: Diagnosis not present

## 2017-05-10 DIAGNOSIS — M549 Dorsalgia, unspecified: Secondary | ICD-10-CM | POA: Diagnosis not present

## 2017-05-10 DIAGNOSIS — A491 Streptococcal infection, unspecified site: Secondary | ICD-10-CM | POA: Diagnosis not present

## 2017-05-10 DIAGNOSIS — E1129 Type 2 diabetes mellitus with other diabetic kidney complication: Secondary | ICD-10-CM | POA: Diagnosis not present

## 2017-05-10 DIAGNOSIS — D509 Iron deficiency anemia, unspecified: Secondary | ICD-10-CM | POA: Diagnosis not present

## 2017-05-10 DIAGNOSIS — G9341 Metabolic encephalopathy: Secondary | ICD-10-CM | POA: Diagnosis not present

## 2017-05-10 DIAGNOSIS — D631 Anemia in chronic kidney disease: Secondary | ICD-10-CM | POA: Diagnosis not present

## 2017-05-11 DIAGNOSIS — M9905 Segmental and somatic dysfunction of pelvic region: Secondary | ICD-10-CM | POA: Diagnosis not present

## 2017-05-11 DIAGNOSIS — M9902 Segmental and somatic dysfunction of thoracic region: Secondary | ICD-10-CM | POA: Diagnosis not present

## 2017-05-11 DIAGNOSIS — M5414 Radiculopathy, thoracic region: Secondary | ICD-10-CM | POA: Diagnosis not present

## 2017-05-11 DIAGNOSIS — M9903 Segmental and somatic dysfunction of lumbar region: Secondary | ICD-10-CM | POA: Diagnosis not present

## 2017-05-12 DIAGNOSIS — N189 Chronic kidney disease, unspecified: Secondary | ICD-10-CM | POA: Diagnosis not present

## 2017-05-12 DIAGNOSIS — D631 Anemia in chronic kidney disease: Secondary | ICD-10-CM | POA: Diagnosis not present

## 2017-05-14 DIAGNOSIS — I517 Cardiomegaly: Secondary | ICD-10-CM | POA: Diagnosis not present

## 2017-05-14 DIAGNOSIS — F05 Delirium due to known physiological condition: Secondary | ICD-10-CM | POA: Diagnosis not present

## 2017-05-14 DIAGNOSIS — R4182 Altered mental status, unspecified: Secondary | ICD-10-CM | POA: Diagnosis not present

## 2017-05-14 DIAGNOSIS — Z66 Do not resuscitate: Secondary | ICD-10-CM | POA: Diagnosis not present

## 2017-05-14 DIAGNOSIS — I10 Essential (primary) hypertension: Secondary | ICD-10-CM | POA: Diagnosis not present

## 2017-05-14 DIAGNOSIS — I4892 Unspecified atrial flutter: Secondary | ICD-10-CM | POA: Diagnosis not present

## 2017-05-14 DIAGNOSIS — N179 Acute kidney failure, unspecified: Secondary | ICD-10-CM | POA: Diagnosis not present

## 2017-05-14 DIAGNOSIS — N184 Chronic kidney disease, stage 4 (severe): Secondary | ICD-10-CM | POA: Diagnosis not present

## 2017-05-14 DIAGNOSIS — G9340 Encephalopathy, unspecified: Secondary | ICD-10-CM | POA: Diagnosis not present

## 2017-05-14 DIAGNOSIS — E1121 Type 2 diabetes mellitus with diabetic nephropathy: Secondary | ICD-10-CM | POA: Diagnosis not present

## 2017-05-14 DIAGNOSIS — E1122 Type 2 diabetes mellitus with diabetic chronic kidney disease: Secondary | ICD-10-CM | POA: Diagnosis not present

## 2017-05-14 DIAGNOSIS — R410 Disorientation, unspecified: Secondary | ICD-10-CM | POA: Diagnosis not present

## 2017-05-14 DIAGNOSIS — R739 Hyperglycemia, unspecified: Secondary | ICD-10-CM | POA: Diagnosis not present

## 2017-05-14 DIAGNOSIS — D649 Anemia, unspecified: Secondary | ICD-10-CM | POA: Diagnosis not present

## 2017-05-14 DIAGNOSIS — N183 Chronic kidney disease, stage 3 (moderate): Secondary | ICD-10-CM | POA: Diagnosis not present

## 2017-05-14 DIAGNOSIS — I252 Old myocardial infarction: Secondary | ICD-10-CM | POA: Diagnosis not present

## 2017-05-14 DIAGNOSIS — E039 Hypothyroidism, unspecified: Secondary | ICD-10-CM | POA: Diagnosis not present

## 2017-05-14 DIAGNOSIS — E11649 Type 2 diabetes mellitus with hypoglycemia without coma: Secondary | ICD-10-CM | POA: Diagnosis not present

## 2017-05-14 DIAGNOSIS — I480 Paroxysmal atrial fibrillation: Secondary | ICD-10-CM | POA: Diagnosis not present

## 2017-05-14 DIAGNOSIS — N139 Obstructive and reflux uropathy, unspecified: Secondary | ICD-10-CM | POA: Diagnosis not present

## 2017-05-14 DIAGNOSIS — Z955 Presence of coronary angioplasty implant and graft: Secondary | ICD-10-CM | POA: Diagnosis not present

## 2017-05-14 DIAGNOSIS — Z8673 Personal history of transient ischemic attack (TIA), and cerebral infarction without residual deficits: Secondary | ICD-10-CM | POA: Diagnosis not present

## 2017-05-14 DIAGNOSIS — E162 Hypoglycemia, unspecified: Secondary | ICD-10-CM | POA: Diagnosis not present

## 2017-05-14 DIAGNOSIS — D62 Acute posthemorrhagic anemia: Secondary | ICD-10-CM | POA: Diagnosis not present

## 2017-05-14 DIAGNOSIS — K922 Gastrointestinal hemorrhage, unspecified: Secondary | ICD-10-CM | POA: Diagnosis not present

## 2017-05-14 DIAGNOSIS — I130 Hypertensive heart and chronic kidney disease with heart failure and stage 1 through stage 4 chronic kidney disease, or unspecified chronic kidney disease: Secondary | ICD-10-CM | POA: Diagnosis not present

## 2017-05-14 DIAGNOSIS — E1364 Other specified diabetes mellitus with hypoglycemia: Secondary | ICD-10-CM | POA: Diagnosis not present

## 2017-05-14 DIAGNOSIS — K2970 Gastritis, unspecified, without bleeding: Secondary | ICD-10-CM | POA: Diagnosis not present

## 2017-05-14 DIAGNOSIS — K648 Other hemorrhoids: Secondary | ICD-10-CM | POA: Diagnosis not present

## 2017-05-14 DIAGNOSIS — K5730 Diverticulosis of large intestine without perforation or abscess without bleeding: Secondary | ICD-10-CM | POA: Diagnosis not present

## 2017-05-14 DIAGNOSIS — I272 Pulmonary hypertension, unspecified: Secondary | ICD-10-CM | POA: Diagnosis not present

## 2017-05-14 DIAGNOSIS — J441 Chronic obstructive pulmonary disease with (acute) exacerbation: Secondary | ICD-10-CM | POA: Diagnosis not present

## 2017-05-14 DIAGNOSIS — E161 Other hypoglycemia: Secondary | ICD-10-CM | POA: Diagnosis not present

## 2017-05-14 DIAGNOSIS — I5022 Chronic systolic (congestive) heart failure: Secondary | ICD-10-CM | POA: Diagnosis not present

## 2017-05-14 DIAGNOSIS — J9600 Acute respiratory failure, unspecified whether with hypoxia or hypercapnia: Secondary | ICD-10-CM | POA: Diagnosis not present

## 2017-05-14 DIAGNOSIS — R195 Other fecal abnormalities: Secondary | ICD-10-CM | POA: Diagnosis not present

## 2017-05-15 DIAGNOSIS — E162 Hypoglycemia, unspecified: Secondary | ICD-10-CM | POA: Diagnosis not present

## 2017-05-15 DIAGNOSIS — K5900 Constipation, unspecified: Secondary | ICD-10-CM | POA: Diagnosis not present

## 2017-05-15 DIAGNOSIS — D62 Acute posthemorrhagic anemia: Secondary | ICD-10-CM | POA: Diagnosis not present

## 2017-05-15 DIAGNOSIS — Z8719 Personal history of other diseases of the digestive system: Secondary | ICD-10-CM | POA: Diagnosis not present

## 2017-05-15 DIAGNOSIS — R195 Other fecal abnormalities: Secondary | ICD-10-CM | POA: Diagnosis not present

## 2017-05-16 DIAGNOSIS — I509 Heart failure, unspecified: Secondary | ICD-10-CM | POA: Diagnosis not present

## 2017-05-16 DIAGNOSIS — D62 Acute posthemorrhagic anemia: Secondary | ICD-10-CM | POA: Diagnosis not present

## 2017-05-16 DIAGNOSIS — K5900 Constipation, unspecified: Secondary | ICD-10-CM | POA: Diagnosis not present

## 2017-05-16 DIAGNOSIS — N179 Acute kidney failure, unspecified: Secondary | ICD-10-CM | POA: Diagnosis not present

## 2017-05-16 DIAGNOSIS — T83410A Breakdown (mechanical) of penile (implanted) prosthesis, initial encounter: Secondary | ICD-10-CM | POA: Diagnosis not present

## 2017-05-16 DIAGNOSIS — Z86010 Personal history of colonic polyps: Secondary | ICD-10-CM | POA: Diagnosis not present

## 2017-05-16 DIAGNOSIS — N472 Paraphimosis: Secondary | ICD-10-CM | POA: Diagnosis not present

## 2017-05-16 DIAGNOSIS — I110 Hypertensive heart disease with heart failure: Secondary | ICD-10-CM | POA: Diagnosis not present

## 2017-05-16 DIAGNOSIS — N183 Chronic kidney disease, stage 3 (moderate): Secondary | ICD-10-CM | POA: Diagnosis not present

## 2017-05-16 DIAGNOSIS — R195 Other fecal abnormalities: Secondary | ICD-10-CM | POA: Diagnosis not present

## 2017-05-16 DIAGNOSIS — E119 Type 2 diabetes mellitus without complications: Secondary | ICD-10-CM | POA: Diagnosis not present

## 2017-05-16 DIAGNOSIS — B954 Other streptococcus as the cause of diseases classified elsewhere: Secondary | ICD-10-CM | POA: Diagnosis not present

## 2017-05-16 DIAGNOSIS — E162 Hypoglycemia, unspecified: Secondary | ICD-10-CM | POA: Diagnosis not present

## 2017-05-17 DIAGNOSIS — R600 Localized edema: Secondary | ICD-10-CM | POA: Diagnosis not present

## 2017-05-17 DIAGNOSIS — B954 Other streptococcus as the cause of diseases classified elsewhere: Secondary | ICD-10-CM | POA: Diagnosis not present

## 2017-05-17 DIAGNOSIS — K5900 Constipation, unspecified: Secondary | ICD-10-CM | POA: Diagnosis not present

## 2017-05-17 DIAGNOSIS — E8779 Other fluid overload: Secondary | ICD-10-CM | POA: Diagnosis not present

## 2017-05-17 DIAGNOSIS — Z86010 Personal history of colonic polyps: Secondary | ICD-10-CM | POA: Diagnosis not present

## 2017-05-17 DIAGNOSIS — E162 Hypoglycemia, unspecified: Secondary | ICD-10-CM | POA: Diagnosis not present

## 2017-05-17 DIAGNOSIS — D62 Acute posthemorrhagic anemia: Secondary | ICD-10-CM | POA: Diagnosis not present

## 2017-05-17 DIAGNOSIS — R062 Wheezing: Secondary | ICD-10-CM | POA: Diagnosis not present

## 2017-05-17 DIAGNOSIS — R195 Other fecal abnormalities: Secondary | ICD-10-CM | POA: Diagnosis not present

## 2017-05-17 DIAGNOSIS — N184 Chronic kidney disease, stage 4 (severe): Secondary | ICD-10-CM | POA: Diagnosis not present

## 2017-05-17 DIAGNOSIS — N170 Acute kidney failure with tubular necrosis: Secondary | ICD-10-CM | POA: Diagnosis not present

## 2017-05-17 DIAGNOSIS — I509 Heart failure, unspecified: Secondary | ICD-10-CM | POA: Diagnosis not present

## 2017-05-18 DIAGNOSIS — N184 Chronic kidney disease, stage 4 (severe): Secondary | ICD-10-CM | POA: Diagnosis not present

## 2017-05-18 DIAGNOSIS — E162 Hypoglycemia, unspecified: Secondary | ICD-10-CM | POA: Diagnosis not present

## 2017-05-18 DIAGNOSIS — N170 Acute kidney failure with tubular necrosis: Secondary | ICD-10-CM | POA: Diagnosis not present

## 2017-05-18 DIAGNOSIS — K5900 Constipation, unspecified: Secondary | ICD-10-CM | POA: Diagnosis not present

## 2017-05-18 DIAGNOSIS — B954 Other streptococcus as the cause of diseases classified elsewhere: Secondary | ICD-10-CM | POA: Diagnosis not present

## 2017-05-18 DIAGNOSIS — Z86010 Personal history of colonic polyps: Secondary | ICD-10-CM | POA: Diagnosis not present

## 2017-05-18 DIAGNOSIS — E8779 Other fluid overload: Secondary | ICD-10-CM | POA: Diagnosis not present

## 2017-05-18 DIAGNOSIS — R195 Other fecal abnormalities: Secondary | ICD-10-CM | POA: Diagnosis not present

## 2017-05-18 DIAGNOSIS — I509 Heart failure, unspecified: Secondary | ICD-10-CM | POA: Diagnosis not present

## 2017-05-18 DIAGNOSIS — D62 Acute posthemorrhagic anemia: Secondary | ICD-10-CM | POA: Diagnosis not present

## 2017-05-19 DIAGNOSIS — K2970 Gastritis, unspecified, without bleeding: Secondary | ICD-10-CM | POA: Diagnosis not present

## 2017-05-19 DIAGNOSIS — D62 Acute posthemorrhagic anemia: Secondary | ICD-10-CM | POA: Diagnosis not present

## 2017-05-19 DIAGNOSIS — K2980 Duodenitis without bleeding: Secondary | ICD-10-CM | POA: Diagnosis not present

## 2017-05-19 DIAGNOSIS — D122 Benign neoplasm of ascending colon: Secondary | ICD-10-CM | POA: Diagnosis not present

## 2017-05-19 DIAGNOSIS — D649 Anemia, unspecified: Secondary | ICD-10-CM | POA: Diagnosis not present

## 2017-05-19 DIAGNOSIS — I10 Essential (primary) hypertension: Secondary | ICD-10-CM | POA: Diagnosis not present

## 2017-05-19 DIAGNOSIS — K635 Polyp of colon: Secondary | ICD-10-CM | POA: Diagnosis not present

## 2017-05-19 DIAGNOSIS — K648 Other hemorrhoids: Secondary | ICD-10-CM | POA: Diagnosis not present

## 2017-05-19 DIAGNOSIS — K2930 Chronic superficial gastritis without bleeding: Secondary | ICD-10-CM | POA: Diagnosis not present

## 2017-05-19 DIAGNOSIS — K5730 Diverticulosis of large intestine without perforation or abscess without bleeding: Secondary | ICD-10-CM | POA: Diagnosis not present

## 2017-05-19 DIAGNOSIS — E1169 Type 2 diabetes mellitus with other specified complication: Secondary | ICD-10-CM | POA: Diagnosis not present

## 2017-05-19 DIAGNOSIS — K5900 Constipation, unspecified: Secondary | ICD-10-CM | POA: Diagnosis not present

## 2017-05-19 DIAGNOSIS — R7881 Bacteremia: Secondary | ICD-10-CM | POA: Diagnosis not present

## 2017-05-19 DIAGNOSIS — N183 Chronic kidney disease, stage 3 (moderate): Secondary | ICD-10-CM | POA: Diagnosis not present

## 2017-05-19 DIAGNOSIS — K2960 Other gastritis without bleeding: Secondary | ICD-10-CM | POA: Diagnosis not present

## 2017-05-19 DIAGNOSIS — E8779 Other fluid overload: Secondary | ICD-10-CM | POA: Diagnosis not present

## 2017-05-19 DIAGNOSIS — D123 Benign neoplasm of transverse colon: Secondary | ICD-10-CM | POA: Diagnosis not present

## 2017-05-19 DIAGNOSIS — J449 Chronic obstructive pulmonary disease, unspecified: Secondary | ICD-10-CM | POA: Diagnosis not present

## 2017-05-19 DIAGNOSIS — D120 Benign neoplasm of cecum: Secondary | ICD-10-CM | POA: Diagnosis not present

## 2017-05-19 DIAGNOSIS — I509 Heart failure, unspecified: Secondary | ICD-10-CM | POA: Diagnosis not present

## 2017-05-19 DIAGNOSIS — R195 Other fecal abnormalities: Secondary | ICD-10-CM | POA: Diagnosis not present

## 2017-05-19 DIAGNOSIS — B954 Other streptococcus as the cause of diseases classified elsewhere: Secondary | ICD-10-CM | POA: Diagnosis not present

## 2017-05-19 DIAGNOSIS — E162 Hypoglycemia, unspecified: Secondary | ICD-10-CM | POA: Diagnosis not present

## 2017-05-20 DIAGNOSIS — E8779 Other fluid overload: Secondary | ICD-10-CM | POA: Diagnosis not present

## 2017-05-20 DIAGNOSIS — I5089 Other heart failure: Secondary | ICD-10-CM | POA: Diagnosis not present

## 2017-05-20 DIAGNOSIS — J449 Chronic obstructive pulmonary disease, unspecified: Secondary | ICD-10-CM | POA: Diagnosis not present

## 2017-05-20 DIAGNOSIS — E162 Hypoglycemia, unspecified: Secondary | ICD-10-CM | POA: Diagnosis not present

## 2017-05-20 DIAGNOSIS — I252 Old myocardial infarction: Secondary | ICD-10-CM | POA: Diagnosis not present

## 2017-05-20 DIAGNOSIS — D649 Anemia, unspecified: Secondary | ICD-10-CM | POA: Diagnosis not present

## 2017-05-20 DIAGNOSIS — N183 Chronic kidney disease, stage 3 (moderate): Secondary | ICD-10-CM | POA: Diagnosis not present

## 2017-05-20 DIAGNOSIS — I447 Left bundle-branch block, unspecified: Secondary | ICD-10-CM | POA: Diagnosis not present

## 2017-05-20 DIAGNOSIS — J9690 Respiratory failure, unspecified, unspecified whether with hypoxia or hypercapnia: Secondary | ICD-10-CM | POA: Diagnosis not present

## 2017-05-20 DIAGNOSIS — I358 Other nonrheumatic aortic valve disorders: Secondary | ICD-10-CM | POA: Diagnosis not present

## 2017-05-20 DIAGNOSIS — R7881 Bacteremia: Secondary | ICD-10-CM | POA: Diagnosis not present

## 2017-05-20 DIAGNOSIS — I480 Paroxysmal atrial fibrillation: Secondary | ICD-10-CM | POA: Diagnosis not present

## 2017-05-20 DIAGNOSIS — Z66 Do not resuscitate: Secondary | ICD-10-CM | POA: Diagnosis not present

## 2017-05-20 DIAGNOSIS — I4891 Unspecified atrial fibrillation: Secondary | ICD-10-CM | POA: Diagnosis not present

## 2017-05-20 DIAGNOSIS — F29 Unspecified psychosis not due to a substance or known physiological condition: Secondary | ICD-10-CM | POA: Diagnosis not present

## 2017-05-20 DIAGNOSIS — E1169 Type 2 diabetes mellitus with other specified complication: Secondary | ICD-10-CM | POA: Diagnosis not present

## 2017-05-20 DIAGNOSIS — R410 Disorientation, unspecified: Secondary | ICD-10-CM | POA: Diagnosis not present

## 2017-05-20 DIAGNOSIS — F17211 Nicotine dependence, cigarettes, in remission: Secondary | ICD-10-CM | POA: Diagnosis not present

## 2017-05-20 DIAGNOSIS — I10 Essential (primary) hypertension: Secondary | ICD-10-CM | POA: Diagnosis not present

## 2017-05-20 DIAGNOSIS — I509 Heart failure, unspecified: Secondary | ICD-10-CM | POA: Diagnosis not present

## 2017-05-21 DIAGNOSIS — I447 Left bundle-branch block, unspecified: Secondary | ICD-10-CM | POA: Diagnosis not present

## 2017-05-21 DIAGNOSIS — E162 Hypoglycemia, unspecified: Secondary | ICD-10-CM | POA: Diagnosis not present

## 2017-05-21 DIAGNOSIS — E1169 Type 2 diabetes mellitus with other specified complication: Secondary | ICD-10-CM | POA: Diagnosis not present

## 2017-05-21 DIAGNOSIS — I10 Essential (primary) hypertension: Secondary | ICD-10-CM | POA: Diagnosis not present

## 2017-05-21 DIAGNOSIS — R4182 Altered mental status, unspecified: Secondary | ICD-10-CM | POA: Diagnosis not present

## 2017-05-21 DIAGNOSIS — E119 Type 2 diabetes mellitus without complications: Secondary | ICD-10-CM | POA: Diagnosis not present

## 2017-05-21 DIAGNOSIS — I2510 Atherosclerotic heart disease of native coronary artery without angina pectoris: Secondary | ICD-10-CM | POA: Diagnosis not present

## 2017-05-21 DIAGNOSIS — R000 Tachycardia, unspecified: Secondary | ICD-10-CM | POA: Diagnosis not present

## 2017-05-21 DIAGNOSIS — R410 Disorientation, unspecified: Secondary | ICD-10-CM | POA: Diagnosis not present

## 2017-05-21 DIAGNOSIS — N183 Chronic kidney disease, stage 3 (moderate): Secondary | ICD-10-CM | POA: Diagnosis not present

## 2017-05-21 DIAGNOSIS — N170 Acute kidney failure with tubular necrosis: Secondary | ICD-10-CM | POA: Diagnosis not present

## 2017-05-21 DIAGNOSIS — I358 Other nonrheumatic aortic valve disorders: Secondary | ICD-10-CM | POA: Diagnosis not present

## 2017-05-21 DIAGNOSIS — I509 Heart failure, unspecified: Secondary | ICD-10-CM | POA: Diagnosis not present

## 2017-05-21 DIAGNOSIS — R195 Other fecal abnormalities: Secondary | ICD-10-CM | POA: Diagnosis not present

## 2017-05-21 DIAGNOSIS — I272 Pulmonary hypertension, unspecified: Secondary | ICD-10-CM | POA: Diagnosis not present

## 2017-05-21 DIAGNOSIS — K922 Gastrointestinal hemorrhage, unspecified: Secondary | ICD-10-CM | POA: Diagnosis not present

## 2017-05-21 DIAGNOSIS — I4891 Unspecified atrial fibrillation: Secondary | ICD-10-CM | POA: Diagnosis not present

## 2017-05-21 DIAGNOSIS — N189 Chronic kidney disease, unspecified: Secondary | ICD-10-CM | POA: Diagnosis not present

## 2017-05-21 DIAGNOSIS — N179 Acute kidney failure, unspecified: Secondary | ICD-10-CM | POA: Diagnosis not present

## 2017-05-21 DIAGNOSIS — J449 Chronic obstructive pulmonary disease, unspecified: Secondary | ICD-10-CM | POA: Diagnosis not present

## 2017-05-21 DIAGNOSIS — G0439 Other acute necrotizing hemorrhagic encephalopathy: Secondary | ICD-10-CM | POA: Diagnosis not present

## 2017-05-22 DIAGNOSIS — E162 Hypoglycemia, unspecified: Secondary | ICD-10-CM | POA: Diagnosis not present

## 2017-05-22 DIAGNOSIS — E1169 Type 2 diabetes mellitus with other specified complication: Secondary | ICD-10-CM | POA: Diagnosis not present

## 2017-05-22 DIAGNOSIS — I130 Hypertensive heart and chronic kidney disease with heart failure and stage 1 through stage 4 chronic kidney disease, or unspecified chronic kidney disease: Secondary | ICD-10-CM | POA: Diagnosis not present

## 2017-05-22 DIAGNOSIS — I509 Heart failure, unspecified: Secondary | ICD-10-CM | POA: Diagnosis not present

## 2017-05-22 DIAGNOSIS — K922 Gastrointestinal hemorrhage, unspecified: Secondary | ICD-10-CM | POA: Diagnosis not present

## 2017-05-22 DIAGNOSIS — R4182 Altered mental status, unspecified: Secondary | ICD-10-CM | POA: Diagnosis not present

## 2017-05-22 DIAGNOSIS — R195 Other fecal abnormalities: Secondary | ICD-10-CM | POA: Diagnosis not present

## 2017-05-22 DIAGNOSIS — I358 Other nonrheumatic aortic valve disorders: Secondary | ICD-10-CM | POA: Diagnosis not present

## 2017-05-22 DIAGNOSIS — I447 Left bundle-branch block, unspecified: Secondary | ICD-10-CM | POA: Diagnosis not present

## 2017-05-22 DIAGNOSIS — I2510 Atherosclerotic heart disease of native coronary artery without angina pectoris: Secondary | ICD-10-CM | POA: Diagnosis not present

## 2017-05-22 DIAGNOSIS — N179 Acute kidney failure, unspecified: Secondary | ICD-10-CM | POA: Diagnosis not present

## 2017-05-22 DIAGNOSIS — I4891 Unspecified atrial fibrillation: Secondary | ICD-10-CM | POA: Diagnosis not present

## 2017-05-22 DIAGNOSIS — G9340 Encephalopathy, unspecified: Secondary | ICD-10-CM | POA: Diagnosis not present

## 2017-05-22 DIAGNOSIS — R410 Disorientation, unspecified: Secondary | ICD-10-CM | POA: Diagnosis not present

## 2017-05-22 DIAGNOSIS — N183 Chronic kidney disease, stage 3 (moderate): Secondary | ICD-10-CM | POA: Diagnosis not present

## 2017-05-22 DIAGNOSIS — N170 Acute kidney failure with tubular necrosis: Secondary | ICD-10-CM | POA: Diagnosis not present

## 2017-05-22 DIAGNOSIS — E119 Type 2 diabetes mellitus without complications: Secondary | ICD-10-CM | POA: Diagnosis not present

## 2017-05-22 DIAGNOSIS — J449 Chronic obstructive pulmonary disease, unspecified: Secondary | ICD-10-CM | POA: Diagnosis not present

## 2017-05-22 DIAGNOSIS — I10 Essential (primary) hypertension: Secondary | ICD-10-CM | POA: Diagnosis not present

## 2017-05-23 DIAGNOSIS — J449 Chronic obstructive pulmonary disease, unspecified: Secondary | ICD-10-CM | POA: Diagnosis not present

## 2017-05-23 DIAGNOSIS — N183 Chronic kidney disease, stage 3 (moderate): Secondary | ICD-10-CM | POA: Diagnosis not present

## 2017-05-23 DIAGNOSIS — E8779 Other fluid overload: Secondary | ICD-10-CM | POA: Diagnosis not present

## 2017-05-23 DIAGNOSIS — K922 Gastrointestinal hemorrhage, unspecified: Secondary | ICD-10-CM | POA: Diagnosis not present

## 2017-05-23 DIAGNOSIS — I509 Heart failure, unspecified: Secondary | ICD-10-CM | POA: Diagnosis not present

## 2017-05-23 DIAGNOSIS — R410 Disorientation, unspecified: Secondary | ICD-10-CM | POA: Diagnosis not present

## 2017-05-23 DIAGNOSIS — I10 Essential (primary) hypertension: Secondary | ICD-10-CM | POA: Diagnosis not present

## 2017-05-23 DIAGNOSIS — R7881 Bacteremia: Secondary | ICD-10-CM | POA: Diagnosis not present

## 2017-05-23 DIAGNOSIS — N179 Acute kidney failure, unspecified: Secondary | ICD-10-CM | POA: Diagnosis not present

## 2017-05-23 DIAGNOSIS — F05 Delirium due to known physiological condition: Secondary | ICD-10-CM | POA: Diagnosis not present

## 2017-05-23 DIAGNOSIS — E1169 Type 2 diabetes mellitus with other specified complication: Secondary | ICD-10-CM | POA: Diagnosis not present

## 2017-05-23 DIAGNOSIS — D649 Anemia, unspecified: Secondary | ICD-10-CM | POA: Diagnosis not present

## 2017-05-23 DIAGNOSIS — F0390 Unspecified dementia without behavioral disturbance: Secondary | ICD-10-CM | POA: Diagnosis not present

## 2017-05-23 DIAGNOSIS — E162 Hypoglycemia, unspecified: Secondary | ICD-10-CM | POA: Diagnosis not present

## 2017-05-23 DIAGNOSIS — R195 Other fecal abnormalities: Secondary | ICD-10-CM | POA: Diagnosis not present

## 2017-05-24 ENCOUNTER — Other Ambulatory Visit: Payer: Self-pay

## 2017-05-24 DIAGNOSIS — I5022 Chronic systolic (congestive) heart failure: Secondary | ICD-10-CM | POA: Diagnosis not present

## 2017-05-24 DIAGNOSIS — J9611 Chronic respiratory failure with hypoxia: Secondary | ICD-10-CM | POA: Diagnosis not present

## 2017-05-24 DIAGNOSIS — F419 Anxiety disorder, unspecified: Secondary | ICD-10-CM | POA: Diagnosis not present

## 2017-05-24 DIAGNOSIS — I130 Hypertensive heart and chronic kidney disease with heart failure and stage 1 through stage 4 chronic kidney disease, or unspecified chronic kidney disease: Secondary | ICD-10-CM | POA: Diagnosis not present

## 2017-05-24 DIAGNOSIS — E1122 Type 2 diabetes mellitus with diabetic chronic kidney disease: Secondary | ICD-10-CM | POA: Diagnosis not present

## 2017-05-24 DIAGNOSIS — N183 Chronic kidney disease, stage 3 (moderate): Secondary | ICD-10-CM | POA: Diagnosis not present

## 2017-05-24 DIAGNOSIS — E785 Hyperlipidemia, unspecified: Secondary | ICD-10-CM | POA: Diagnosis not present

## 2017-05-24 DIAGNOSIS — I480 Paroxysmal atrial fibrillation: Secondary | ICD-10-CM | POA: Diagnosis not present

## 2017-05-24 DIAGNOSIS — J449 Chronic obstructive pulmonary disease, unspecified: Secondary | ICD-10-CM | POA: Diagnosis not present

## 2017-05-24 DIAGNOSIS — F0391 Unspecified dementia with behavioral disturbance: Secondary | ICD-10-CM | POA: Diagnosis not present

## 2017-05-24 DIAGNOSIS — Z9981 Dependence on supplemental oxygen: Secondary | ICD-10-CM | POA: Diagnosis not present

## 2017-05-24 DIAGNOSIS — K635 Polyp of colon: Secondary | ICD-10-CM | POA: Diagnosis not present

## 2017-05-24 DIAGNOSIS — Z792 Long term (current) use of antibiotics: Secondary | ICD-10-CM | POA: Diagnosis not present

## 2017-05-24 DIAGNOSIS — Z452 Encounter for adjustment and management of vascular access device: Secondary | ICD-10-CM | POA: Diagnosis not present

## 2017-05-24 DIAGNOSIS — I2510 Atherosclerotic heart disease of native coronary artery without angina pectoris: Secondary | ICD-10-CM | POA: Diagnosis not present

## 2017-05-24 DIAGNOSIS — D631 Anemia in chronic kidney disease: Secondary | ICD-10-CM | POA: Diagnosis not present

## 2017-05-24 DIAGNOSIS — J441 Chronic obstructive pulmonary disease with (acute) exacerbation: Secondary | ICD-10-CM | POA: Diagnosis not present

## 2017-05-24 DIAGNOSIS — I330 Acute and subacute infective endocarditis: Secondary | ICD-10-CM | POA: Diagnosis not present

## 2017-05-24 NOTE — Patient Outreach (Signed)
[place] [facility]) Care Management  05/24/2017  Mario Small Sep 25, 1933 [id number]  TELEPHONE SCREENING Referral date: 05/23/17 Referral source: Nurse call line Referral reason: Patients oxygen equipment was not working properly and wife had question about PICC line medication replacement. Insurance: [employer] advantage.  Attempt #1   SUBJECTIVE:  Telephone call to patient regarding Nurse call line referral. HIPAA verified with patient. Patient gave verbal persmission to speak with his wife, Mario Small, " bobbie" Small regarding all of his health information. Wife states patients oxygen is working ok at this time. Wife states patient is on 2 1/2 liters of oxygen. Wife states she spoke with [place] home health and the nurse will be coming today to address patients PICC line and medication.  Wife states patient has been very sick. Wife states patient has been in the hospital 2 within the past month. Wife states patient was discharged on yesterday 05/23/17 from  The hospital. Wife states patient was in the hospital for 9 days due to heart failure and his blood sugar dropping to 23 prior to admission. Wife states [facility] home [facility] is seeing patient 1 time per week for PICC line change and assessment. Wife states patient has heart failure, COPD, diabetes and atrial fibrillation.  RNCM discussed and offered [facility] care management services to patient. Wife verbally agreed to services.  Wife states she is concerned about what may happen to patient. Wife states she is "giving out." Wife states she , " has done all she can do." Wife states she would like to speak with someone regarding financial concerns and long term care for patient. Wife states she is concerned about her home because her home is paid for and she is not sure how long term care for patient will affect maintaining/ keeping their home.  RNCM requested patient look on patients oxygen equipment and verify contact number for equipment  company. Wife verified number for [facility] on equipment. RNCM advised patient's wife that she can call [facility] regarding any concerns regarding patients oxygen equipment. Wife states she has contact phone number for [place] home health services.  Wife refused any information regarding Advance directive for patient.   RNCM advised patient to notify MD of any changes in condition prior to scheduled appointment. RNCM provided contact name and number: [phone] or main office number [phone] and 24 hour nurse advise line [phone].  RNCM verified patient aware of 911 services for urgent/ emergent needs.  PROVIDERS: Dr. Nelda Bucks  SOCIAL/ SUPPORTIVE CARE: Patient lives with his wife. Wife is primary caregiver for patient.   PLAN: RNCM will refer patient to community case manager for transition of care follow up  and to Education officer, museum.  Quinn Plowman RN,BSN,CCM [facility] Telephonic  [phone]

## 2017-05-26 ENCOUNTER — Other Ambulatory Visit: Payer: Self-pay | Admitting: *Deleted

## 2017-05-26 ENCOUNTER — Other Ambulatory Visit: Payer: Self-pay

## 2017-05-26 NOTE — Patient Outreach (Signed)
[place] [facility]) Care Management  05/26/2017  Mario Small 06-24-1933 [id number]   CSW received referral today to reach out to patient and spouse regarding possible support and home needs; as well as possible placement in a rehab setting. CSW spoke to [place], patient's wife of 37 years. She advises that she feels he was released from hospital too soon and that she wants and needs to pursue some rehab inpatient. CSW advised wife of steps to be taken to determine options, coverage, etc for this to occur.  CSW will update wife as progress is made.     Eduard Clos, MSW, [place] Worker  [place] [phone]

## 2017-05-26 NOTE — Patient Outreach (Signed)
New referral for transition of care:  Discharge date of 05/23/2017 Initial transition of care completed on 05/24/2017 by Quinn Plowman.  I placed call to patient and spoke with wife who is documented as the person to speak to. Wife states patient does not sleep well at night and sleeps during the day. Wife states that patient initially refused rehab but she thinks he needs it now.  Wife states that patient was not able to do physical therapy today because he was up all night.  Wife reports that patient has a PICC line and she gives him antibiotics daily until 06/02/2017.  Reports home health nurse comes once a week.   Follow up: transition of care office visit with primary MD was cancelled according to wife because patient is not able to attend.  Reviewed importance of attending these appointments.  Medications: Wife reports she is giving patient all his medications as prescrined.  DM: Wife concerned because patient is not eating well. Reports recent low CBG of 24 when admitted to the hospital. Wife states she is monitoring CBG closely due to lack of appetite.  Reviewed importance of wife speaking with MD about this concern.   PLAN: reviewed [facility] program.  Offered home visit to assess needs. Home visit planned for 06/03/2017.  Provided my contact information. Sent in basket message to [facility] social worker and requested call to wife to discuss placement asap.  Tomasa Rand, RN, BSN, CEN [facility] [employer] [phone]

## 2017-05-28 DIAGNOSIS — R410 Disorientation, unspecified: Secondary | ICD-10-CM | POA: Diagnosis not present

## 2017-05-28 DIAGNOSIS — I350 Nonrheumatic aortic (valve) stenosis: Secondary | ICD-10-CM | POA: Diagnosis not present

## 2017-05-28 DIAGNOSIS — N170 Acute kidney failure with tubular necrosis: Secondary | ICD-10-CM | POA: Diagnosis not present

## 2017-05-28 DIAGNOSIS — R002 Palpitations: Secondary | ICD-10-CM | POA: Diagnosis not present

## 2017-05-28 DIAGNOSIS — Z66 Do not resuscitate: Secondary | ICD-10-CM | POA: Diagnosis not present

## 2017-05-28 DIAGNOSIS — I639 Cerebral infarction, unspecified: Secondary | ICD-10-CM | POA: Diagnosis not present

## 2017-05-28 DIAGNOSIS — G8929 Other chronic pain: Secondary | ICD-10-CM | POA: Diagnosis not present

## 2017-05-28 DIAGNOSIS — Z515 Encounter for palliative care: Secondary | ICD-10-CM | POA: Diagnosis not present

## 2017-05-28 DIAGNOSIS — F05 Delirium due to known physiological condition: Secondary | ICD-10-CM | POA: Diagnosis not present

## 2017-05-28 DIAGNOSIS — R079 Chest pain, unspecified: Secondary | ICD-10-CM | POA: Diagnosis not present

## 2017-05-28 DIAGNOSIS — Z952 Presence of prosthetic heart valve: Secondary | ICD-10-CM | POA: Diagnosis not present

## 2017-05-28 DIAGNOSIS — F0150 Vascular dementia without behavioral disturbance: Secondary | ICD-10-CM | POA: Diagnosis not present

## 2017-05-28 DIAGNOSIS — N390 Urinary tract infection, site not specified: Secondary | ICD-10-CM | POA: Diagnosis not present

## 2017-05-28 DIAGNOSIS — E039 Hypothyroidism, unspecified: Secondary | ICD-10-CM | POA: Diagnosis not present

## 2017-05-28 DIAGNOSIS — I358 Other nonrheumatic aortic valve disorders: Secondary | ICD-10-CM | POA: Diagnosis not present

## 2017-05-28 DIAGNOSIS — I130 Hypertensive heart and chronic kidney disease with heart failure and stage 1 through stage 4 chronic kidney disease, or unspecified chronic kidney disease: Secondary | ICD-10-CM | POA: Diagnosis not present

## 2017-05-28 DIAGNOSIS — E876 Hypokalemia: Secondary | ICD-10-CM | POA: Diagnosis not present

## 2017-05-28 DIAGNOSIS — F17211 Nicotine dependence, cigarettes, in remission: Secondary | ICD-10-CM | POA: Diagnosis not present

## 2017-05-28 DIAGNOSIS — M549 Dorsalgia, unspecified: Secondary | ICD-10-CM | POA: Diagnosis not present

## 2017-05-28 DIAGNOSIS — Z955 Presence of coronary angioplasty implant and graft: Secondary | ICD-10-CM | POA: Diagnosis not present

## 2017-05-28 DIAGNOSIS — I330 Acute and subacute infective endocarditis: Secondary | ICD-10-CM | POA: Diagnosis not present

## 2017-05-28 DIAGNOSIS — N189 Chronic kidney disease, unspecified: Secondary | ICD-10-CM | POA: Diagnosis not present

## 2017-05-28 DIAGNOSIS — I252 Old myocardial infarction: Secondary | ICD-10-CM | POA: Diagnosis not present

## 2017-05-28 DIAGNOSIS — J811 Chronic pulmonary edema: Secondary | ICD-10-CM | POA: Diagnosis not present

## 2017-05-28 DIAGNOSIS — F0151 Vascular dementia with behavioral disturbance: Secondary | ICD-10-CM | POA: Diagnosis not present

## 2017-05-28 DIAGNOSIS — N289 Disorder of kidney and ureter, unspecified: Secondary | ICD-10-CM | POA: Diagnosis not present

## 2017-05-28 DIAGNOSIS — E1122 Type 2 diabetes mellitus with diabetic chronic kidney disease: Secondary | ICD-10-CM | POA: Diagnosis not present

## 2017-05-28 DIAGNOSIS — I509 Heart failure, unspecified: Secondary | ICD-10-CM | POA: Diagnosis not present

## 2017-05-28 DIAGNOSIS — R600 Localized edema: Secondary | ICD-10-CM | POA: Diagnosis not present

## 2017-05-28 DIAGNOSIS — Z8673 Personal history of transient ischemic attack (TIA), and cerebral infarction without residual deficits: Secondary | ICD-10-CM | POA: Diagnosis not present

## 2017-05-28 DIAGNOSIS — I447 Left bundle-branch block, unspecified: Secondary | ICD-10-CM | POA: Diagnosis not present

## 2017-05-28 DIAGNOSIS — J90 Pleural effusion, not elsewhere classified: Secondary | ICD-10-CM | POA: Diagnosis not present

## 2017-05-28 DIAGNOSIS — N179 Acute kidney failure, unspecified: Secondary | ICD-10-CM | POA: Diagnosis not present

## 2017-05-28 DIAGNOSIS — N182 Chronic kidney disease, stage 2 (mild): Secondary | ICD-10-CM | POA: Diagnosis not present

## 2017-05-28 DIAGNOSIS — Z87891 Personal history of nicotine dependence: Secondary | ICD-10-CM | POA: Diagnosis not present

## 2017-05-28 DIAGNOSIS — D649 Anemia, unspecified: Secondary | ICD-10-CM | POA: Diagnosis not present

## 2017-05-28 DIAGNOSIS — I2510 Atherosclerotic heart disease of native coronary artery without angina pectoris: Secondary | ICD-10-CM | POA: Diagnosis not present

## 2017-05-28 DIAGNOSIS — N183 Chronic kidney disease, stage 3 (moderate): Secondary | ICD-10-CM | POA: Diagnosis not present

## 2017-05-28 DIAGNOSIS — D696 Thrombocytopenia, unspecified: Secondary | ICD-10-CM | POA: Diagnosis not present

## 2017-05-28 DIAGNOSIS — I69919 Unspecified symptoms and signs involving cognitive functions following unspecified cerebrovascular disease: Secondary | ICD-10-CM | POA: Diagnosis not present

## 2017-05-28 DIAGNOSIS — E8770 Fluid overload, unspecified: Secondary | ICD-10-CM | POA: Diagnosis not present

## 2017-05-28 DIAGNOSIS — R0789 Other chest pain: Secondary | ICD-10-CM | POA: Diagnosis not present

## 2017-05-28 DIAGNOSIS — M545 Low back pain: Secondary | ICD-10-CM | POA: Diagnosis not present

## 2017-05-28 DIAGNOSIS — R7881 Bacteremia: Secondary | ICD-10-CM | POA: Diagnosis not present

## 2017-05-28 DIAGNOSIS — Z7901 Long term (current) use of anticoagulants: Secondary | ICD-10-CM | POA: Diagnosis not present

## 2017-05-28 DIAGNOSIS — R5381 Other malaise: Secondary | ICD-10-CM | POA: Diagnosis not present

## 2017-05-28 DIAGNOSIS — M7989 Other specified soft tissue disorders: Secondary | ICD-10-CM | POA: Diagnosis not present

## 2017-05-28 DIAGNOSIS — I5022 Chronic systolic (congestive) heart failure: Secondary | ICD-10-CM | POA: Diagnosis not present

## 2017-05-28 DIAGNOSIS — I480 Paroxysmal atrial fibrillation: Secondary | ICD-10-CM | POA: Diagnosis not present

## 2017-05-28 DIAGNOSIS — R4182 Altered mental status, unspecified: Secondary | ICD-10-CM | POA: Diagnosis not present

## 2017-05-28 DIAGNOSIS — N2889 Other specified disorders of kidney and ureter: Secondary | ICD-10-CM | POA: Diagnosis not present

## 2017-05-28 DIAGNOSIS — M5136 Other intervertebral disc degeneration, lumbar region: Secondary | ICD-10-CM | POA: Diagnosis not present

## 2017-05-28 DIAGNOSIS — I272 Pulmonary hypertension, unspecified: Secondary | ICD-10-CM | POA: Diagnosis not present

## 2017-05-29 DIAGNOSIS — I5022 Chronic systolic (congestive) heart failure: Secondary | ICD-10-CM | POA: Diagnosis not present

## 2017-05-29 DIAGNOSIS — I350 Nonrheumatic aortic (valve) stenosis: Secondary | ICD-10-CM | POA: Diagnosis not present

## 2017-05-29 DIAGNOSIS — I447 Left bundle-branch block, unspecified: Secondary | ICD-10-CM | POA: Diagnosis not present

## 2017-05-29 DIAGNOSIS — R7881 Bacteremia: Secondary | ICD-10-CM | POA: Diagnosis not present

## 2017-05-29 DIAGNOSIS — I69919 Unspecified symptoms and signs involving cognitive functions following unspecified cerebrovascular disease: Secondary | ICD-10-CM | POA: Diagnosis not present

## 2017-05-29 DIAGNOSIS — N182 Chronic kidney disease, stage 2 (mild): Secondary | ICD-10-CM | POA: Diagnosis not present

## 2017-05-29 DIAGNOSIS — R079 Chest pain, unspecified: Secondary | ICD-10-CM | POA: Diagnosis not present

## 2017-05-29 DIAGNOSIS — F17211 Nicotine dependence, cigarettes, in remission: Secondary | ICD-10-CM | POA: Diagnosis not present

## 2017-05-29 DIAGNOSIS — R0789 Other chest pain: Secondary | ICD-10-CM | POA: Diagnosis not present

## 2017-05-29 DIAGNOSIS — I509 Heart failure, unspecified: Secondary | ICD-10-CM | POA: Diagnosis not present

## 2017-05-29 DIAGNOSIS — G8929 Other chronic pain: Secondary | ICD-10-CM | POA: Diagnosis not present

## 2017-05-29 DIAGNOSIS — I252 Old myocardial infarction: Secondary | ICD-10-CM | POA: Diagnosis not present

## 2017-05-29 DIAGNOSIS — I480 Paroxysmal atrial fibrillation: Secondary | ICD-10-CM | POA: Diagnosis not present

## 2017-05-29 DIAGNOSIS — M545 Low back pain: Secondary | ICD-10-CM | POA: Diagnosis not present

## 2017-05-29 DIAGNOSIS — D696 Thrombocytopenia, unspecified: Secondary | ICD-10-CM | POA: Diagnosis not present

## 2017-05-29 DIAGNOSIS — Z955 Presence of coronary angioplasty implant and graft: Secondary | ICD-10-CM | POA: Diagnosis not present

## 2017-05-29 DIAGNOSIS — I2510 Atherosclerotic heart disease of native coronary artery without angina pectoris: Secondary | ICD-10-CM | POA: Diagnosis not present

## 2017-05-29 DIAGNOSIS — F0151 Vascular dementia with behavioral disturbance: Secondary | ICD-10-CM | POA: Diagnosis not present

## 2017-05-29 DIAGNOSIS — J811 Chronic pulmonary edema: Secondary | ICD-10-CM | POA: Diagnosis not present

## 2017-05-30 ENCOUNTER — Other Ambulatory Visit: Payer: Self-pay | Admitting: *Deleted

## 2017-05-30 DIAGNOSIS — J811 Chronic pulmonary edema: Secondary | ICD-10-CM | POA: Diagnosis not present

## 2017-05-30 DIAGNOSIS — F0151 Vascular dementia with behavioral disturbance: Secondary | ICD-10-CM | POA: Diagnosis not present

## 2017-05-30 DIAGNOSIS — I69919 Unspecified symptoms and signs involving cognitive functions following unspecified cerebrovascular disease: Secondary | ICD-10-CM | POA: Diagnosis not present

## 2017-05-30 DIAGNOSIS — N182 Chronic kidney disease, stage 2 (mild): Secondary | ICD-10-CM | POA: Diagnosis not present

## 2017-05-30 DIAGNOSIS — I358 Other nonrheumatic aortic valve disorders: Secondary | ICD-10-CM | POA: Diagnosis not present

## 2017-05-30 DIAGNOSIS — R0789 Other chest pain: Secondary | ICD-10-CM | POA: Diagnosis not present

## 2017-05-30 DIAGNOSIS — G8929 Other chronic pain: Secondary | ICD-10-CM | POA: Diagnosis not present

## 2017-05-30 DIAGNOSIS — Z66 Do not resuscitate: Secondary | ICD-10-CM | POA: Diagnosis not present

## 2017-05-30 DIAGNOSIS — I5022 Chronic systolic (congestive) heart failure: Secondary | ICD-10-CM | POA: Diagnosis not present

## 2017-05-30 DIAGNOSIS — D649 Anemia, unspecified: Secondary | ICD-10-CM | POA: Diagnosis not present

## 2017-05-30 DIAGNOSIS — J90 Pleural effusion, not elsewhere classified: Secondary | ICD-10-CM | POA: Diagnosis not present

## 2017-05-30 DIAGNOSIS — M545 Low back pain: Secondary | ICD-10-CM | POA: Diagnosis not present

## 2017-05-30 DIAGNOSIS — I480 Paroxysmal atrial fibrillation: Secondary | ICD-10-CM | POA: Diagnosis not present

## 2017-05-30 DIAGNOSIS — N179 Acute kidney failure, unspecified: Secondary | ICD-10-CM | POA: Diagnosis not present

## 2017-05-30 DIAGNOSIS — I2510 Atherosclerotic heart disease of native coronary artery without angina pectoris: Secondary | ICD-10-CM | POA: Diagnosis not present

## 2017-05-30 DIAGNOSIS — N289 Disorder of kidney and ureter, unspecified: Secondary | ICD-10-CM | POA: Diagnosis not present

## 2017-05-30 DIAGNOSIS — R079 Chest pain, unspecified: Secondary | ICD-10-CM | POA: Diagnosis not present

## 2017-05-30 DIAGNOSIS — D696 Thrombocytopenia, unspecified: Secondary | ICD-10-CM | POA: Diagnosis not present

## 2017-05-30 NOTE — Patient Outreach (Signed)
[place] [facility]) Care Management  05/30/2017  Mario Small 1932/12/03 [id number]     CSW contacted patient's wife who reports patient was admitted to [facility] on Saturday with afib.  Wife reports, "they have told me if I get to bring him home I will need to get hospice involved."  CSW offered support and shared the benefits of hospice involvement. CSW offered emotional support and will plan f/u call later in week.        Eduard Clos, MSW, [place] Worker  [place] [phone]

## 2017-05-31 ENCOUNTER — Other Ambulatory Visit: Payer: Self-pay

## 2017-05-31 DIAGNOSIS — N183 Chronic kidney disease, stage 3 (moderate): Secondary | ICD-10-CM | POA: Diagnosis not present

## 2017-05-31 DIAGNOSIS — N182 Chronic kidney disease, stage 2 (mild): Secondary | ICD-10-CM | POA: Diagnosis not present

## 2017-05-31 DIAGNOSIS — I480 Paroxysmal atrial fibrillation: Secondary | ICD-10-CM | POA: Diagnosis not present

## 2017-05-31 DIAGNOSIS — N2889 Other specified disorders of kidney and ureter: Secondary | ICD-10-CM | POA: Diagnosis not present

## 2017-05-31 DIAGNOSIS — N170 Acute kidney failure with tubular necrosis: Secondary | ICD-10-CM | POA: Diagnosis not present

## 2017-05-31 DIAGNOSIS — R0789 Other chest pain: Secondary | ICD-10-CM | POA: Diagnosis not present

## 2017-05-31 DIAGNOSIS — I509 Heart failure, unspecified: Secondary | ICD-10-CM | POA: Diagnosis not present

## 2017-05-31 DIAGNOSIS — I358 Other nonrheumatic aortic valve disorders: Secondary | ICD-10-CM | POA: Diagnosis not present

## 2017-05-31 DIAGNOSIS — R410 Disorientation, unspecified: Secondary | ICD-10-CM | POA: Diagnosis not present

## 2017-05-31 DIAGNOSIS — R079 Chest pain, unspecified: Secondary | ICD-10-CM | POA: Diagnosis not present

## 2017-05-31 DIAGNOSIS — I639 Cerebral infarction, unspecified: Secondary | ICD-10-CM | POA: Diagnosis not present

## 2017-05-31 DIAGNOSIS — D649 Anemia, unspecified: Secondary | ICD-10-CM | POA: Diagnosis not present

## 2017-05-31 NOTE — Patient Outreach (Signed)
Care Coordination: Received message from [facility] office stating wife called and reports patient is in the hospital and wife is cancelling home visit that was set up for Friday 06/03/2017.  PLAN: will cancel home visit and await notification of discharge plan from [place].  Tomasa Rand, RN, BSN, CEN [facility] [employer] [phone]

## 2017-06-01 ENCOUNTER — Other Ambulatory Visit: Payer: Self-pay | Admitting: *Deleted

## 2017-06-01 ENCOUNTER — Ambulatory Visit: Payer: Self-pay | Admitting: *Deleted

## 2017-06-01 DIAGNOSIS — R079 Chest pain, unspecified: Secondary | ICD-10-CM | POA: Diagnosis not present

## 2017-06-02 ENCOUNTER — Other Ambulatory Visit: Payer: Self-pay | Admitting: *Deleted

## 2017-06-02 ENCOUNTER — Other Ambulatory Visit: Payer: Self-pay

## 2017-06-02 NOTE — Patient Outreach (Signed)
Case closure: Notified by [facility] social worker that patient was discharge home in the care of hospice.  PLAN: Will close case. [facility] social worker sent letter to MD. Tomasa Rand, RN, BSN, CEN [place] Coordinator [phone]

## 2017-06-02 NOTE — Patient Outreach (Signed)
[place] [facility]) Care Management  06/02/2017  IMRAN NUON 27-Feb-1933 [id number]   CSW spoke with patient's wife who reports she was able to bring him home with Hospice care on yesterday. "His kidneys are failing and they have said his time is limited". CSW offered the wife support. CSW will close case due to plans for hospice/end of life care.  CSW will advise PCP and [facility] team of above.      Eduard Clos, MSW, [place] Worker  [place] [phone]

## 2017-06-03 ENCOUNTER — Ambulatory Visit: Payer: Self-pay | Admitting: *Deleted

## 2017-06-03 ENCOUNTER — Ambulatory Visit: Payer: PPO

## 2017-06-06 NOTE — Telephone Encounter (Signed)
This encounter was created in error - please disregard.

## 2017-06-22 DEATH — deceased
